# Patient Record
Sex: Male | Born: 1960 | Race: Black or African American | Hispanic: No | Marital: Single | Smoking: Former smoker
Health system: Southern US, Community
[De-identification: ages and names within clinical notes are randomized; demographics above are authoritative.]

## PROBLEM LIST (undated history)

## (undated) DIAGNOSIS — Z973 Presence of spectacles and contact lenses: Secondary | ICD-10-CM

## (undated) DIAGNOSIS — Z8619 Personal history of other infectious and parasitic diseases: Secondary | ICD-10-CM

## (undated) DIAGNOSIS — Q549 Hypospadias, unspecified: Secondary | ICD-10-CM

## (undated) DIAGNOSIS — R369 Urethral discharge, unspecified: Secondary | ICD-10-CM

## (undated) DIAGNOSIS — E785 Hyperlipidemia, unspecified: Secondary | ICD-10-CM

## (undated) DIAGNOSIS — K429 Umbilical hernia without obstruction or gangrene: Secondary | ICD-10-CM

## (undated) HISTORY — DX: Umbilical hernia without obstruction or gangrene: K42.9

## (undated) HISTORY — DX: Personal history of other infectious and parasitic diseases: Z86.19

## (undated) HISTORY — PX: COLONOSCOPY: SHX174

## (undated) HISTORY — DX: Urethral discharge, unspecified: R36.9

## (undated) HISTORY — DX: Hypospadias, unspecified: Q54.9

## (undated) HISTORY — PX: HYPOSPADIAS CORRECTION: SHX483

## (undated) HISTORY — PX: HERNIA REPAIR: SHX51

## (undated) HISTORY — PX: EYE SURGERY: SHX253

## (undated) HISTORY — DX: Hyperlipidemia, unspecified: E78.5

---

## 2006-12-28 ENCOUNTER — Ambulatory Visit: Payer: Self-pay | Admitting: Internal Medicine

## 2006-12-28 LAB — CONVERTED CEMR LAB
AST: 30 units/L (ref 0–37)
BUN: 10 mg/dL (ref 6–23)
Bilirubin Urine: NEGATIVE
CO2: 28 meq/L (ref 19–32)
Calcium: 10.1 mg/dL (ref 8.4–10.5)
Chloride: 100 meq/L (ref 96–112)
Chol/HDL Ratio, serum: 3.2
Cholesterol: 136 mg/dL (ref 0–200)
Creatinine, Ser: 1.1 mg/dL (ref 0.4–1.5)
Eosinophil percent: 8.5 % — ABNORMAL HIGH (ref 0.0–5.0)
HCT: 49.8 % (ref 39.0–52.0)
Hemoglobin: 16.6 g/dL (ref 13.0–17.0)
Ketones, ur: NEGATIVE mg/dL
LDL Cholesterol: 80 mg/dL (ref 0–99)
Lymphocytes Relative: 42.1 % (ref 12.0–46.0)
MCHC: 33.3 g/dL (ref 30.0–36.0)
MCV: 93.2 fL (ref 78.0–100.0)
Monocytes Absolute: 0.4 10*3/uL (ref 0.2–0.7)
Neutro Abs: 1.2 10*3/uL — ABNORMAL LOW (ref 1.4–7.7)
Neutrophils Relative %: 36.1 % — ABNORMAL LOW (ref 43.0–77.0)
PSA: 0.73 ng/mL (ref 0.10–4.00)
RDW: 11 % — ABNORMAL LOW (ref 11.5–14.6)
TSH: 1.02 microintl units/mL (ref 0.35–5.50)
Triglyceride fasting, serum: 63 mg/dL (ref 0–149)
Urine Glucose: NEGATIVE mg/dL

## 2007-01-04 ENCOUNTER — Ambulatory Visit: Payer: Self-pay | Admitting: Internal Medicine

## 2007-01-04 LAB — CONVERTED CEMR LAB
ALT: 38 units/L (ref 0–40)
AST: 31 units/L (ref 0–37)
Albumin: 4.6 g/dL (ref 3.5–5.2)
Basophils Absolute: 0 10*3/uL (ref 0.0–0.1)
Bilirubin Urine: NEGATIVE
Calcium: 10 mg/dL (ref 8.4–10.5)
Chloride: 102 meq/L (ref 96–112)
GFR calc non Af Amer: 77 mL/min
LDL Cholesterol: 79 mg/dL (ref 0–99)
Lymphocytes Relative: 38.5 % (ref 12.0–46.0)
MCV: 91.3 fL (ref 78.0–100.0)
Monocytes Relative: 10.6 % (ref 3.0–11.0)
Neutro Abs: 1.5 10*3/uL (ref 1.4–7.7)
Platelets: 189 10*3/uL (ref 150–400)
Specific Gravity, Urine: 1.02 (ref 1.000–1.03)
Total CHOL/HDL Ratio: 3
Total Protein, Urine: NEGATIVE mg/dL
Triglycerides: 41 mg/dL (ref 0–149)
Urine Glucose: NEGATIVE mg/dL

## 2007-04-12 ENCOUNTER — Ambulatory Visit: Payer: Self-pay | Admitting: Internal Medicine

## 2007-04-12 LAB — CONVERTED CEMR LAB
Basophils Absolute: 0 10*3/uL (ref 0.0–0.1)
Eosinophils Absolute: 0.4 10*3/uL (ref 0.0–0.6)
HCT: 47.4 % (ref 39.0–52.0)
Hemoglobin: 15.9 g/dL (ref 13.0–17.0)
MCHC: 33.5 g/dL (ref 30.0–36.0)
MCV: 91.3 fL (ref 78.0–100.0)
Monocytes Absolute: 0.4 10*3/uL (ref 0.2–0.7)
Neutrophils Relative %: 41.2 % — ABNORMAL LOW (ref 43.0–77.0)
RDW: 11 % — ABNORMAL LOW (ref 11.5–14.6)

## 2007-04-26 ENCOUNTER — Ambulatory Visit: Payer: Self-pay | Admitting: Internal Medicine

## 2007-04-26 LAB — CONVERTED CEMR LAB
ALT: 23 units/L (ref 0–40)
Alkaline Phosphatase: 72 units/L (ref 39–117)
HCV Ab: NEGATIVE
Total CHOL/HDL Ratio: 5
Total Protein: 7.6 g/dL (ref 6.0–8.3)

## 2007-05-02 ENCOUNTER — Ambulatory Visit: Payer: Self-pay | Admitting: Internal Medicine

## 2007-05-02 LAB — CONVERTED CEMR LAB: Hep B Core Total Ab: POSITIVE — AB

## 2007-05-03 ENCOUNTER — Ambulatory Visit: Payer: Self-pay | Admitting: Internal Medicine

## 2007-06-20 ENCOUNTER — Ambulatory Visit: Payer: Self-pay | Admitting: Internal Medicine

## 2007-09-06 ENCOUNTER — Ambulatory Visit: Payer: Self-pay | Admitting: Internal Medicine

## 2007-09-18 ENCOUNTER — Ambulatory Visit: Payer: Self-pay

## 2007-09-27 ENCOUNTER — Ambulatory Visit: Payer: Self-pay | Admitting: Internal Medicine

## 2007-09-27 DIAGNOSIS — R0789 Other chest pain: Secondary | ICD-10-CM | POA: Insufficient documentation

## 2007-09-27 DIAGNOSIS — K219 Gastro-esophageal reflux disease without esophagitis: Secondary | ICD-10-CM | POA: Insufficient documentation

## 2007-09-27 DIAGNOSIS — E785 Hyperlipidemia, unspecified: Secondary | ICD-10-CM | POA: Insufficient documentation

## 2008-01-16 ENCOUNTER — Ambulatory Visit: Payer: Self-pay | Admitting: Internal Medicine

## 2008-01-16 DIAGNOSIS — R42 Dizziness and giddiness: Secondary | ICD-10-CM | POA: Insufficient documentation

## 2008-01-17 ENCOUNTER — Encounter: Payer: Self-pay | Admitting: Internal Medicine

## 2008-01-17 ENCOUNTER — Telehealth: Payer: Self-pay | Admitting: Internal Medicine

## 2008-04-21 ENCOUNTER — Ambulatory Visit: Payer: Self-pay | Admitting: Internal Medicine

## 2008-04-21 DIAGNOSIS — G4720 Circadian rhythm sleep disorder, unspecified type: Secondary | ICD-10-CM

## 2008-04-21 DIAGNOSIS — J069 Acute upper respiratory infection, unspecified: Secondary | ICD-10-CM | POA: Insufficient documentation

## 2008-04-21 DIAGNOSIS — F518 Other sleep disorders not due to a substance or known physiological condition: Secondary | ICD-10-CM | POA: Insufficient documentation

## 2008-08-07 ENCOUNTER — Ambulatory Visit: Payer: Self-pay | Admitting: Internal Medicine

## 2008-08-07 DIAGNOSIS — R5383 Other fatigue: Secondary | ICD-10-CM

## 2008-08-07 DIAGNOSIS — R369 Urethral discharge, unspecified: Secondary | ICD-10-CM | POA: Insufficient documentation

## 2008-08-07 DIAGNOSIS — R5381 Other malaise: Secondary | ICD-10-CM | POA: Insufficient documentation

## 2008-08-15 ENCOUNTER — Encounter: Payer: Self-pay | Admitting: Internal Medicine

## 2008-09-01 ENCOUNTER — Ambulatory Visit: Payer: Self-pay | Admitting: Internal Medicine

## 2008-09-01 LAB — CONVERTED CEMR LAB
ALT: 27 units/L (ref 0–53)
AST: 24 units/L (ref 0–37)
Albumin: 4.4 g/dL (ref 3.5–5.2)
Alkaline Phosphatase: 66 units/L (ref 39–117)
BUN: 10 mg/dL (ref 6–23)
Bilirubin, Direct: 0.3 mg/dL (ref 0.0–0.3)
CO2: 30 meq/L (ref 19–32)
Calcium: 9.4 mg/dL (ref 8.4–10.5)
Chloride: 113 meq/L — ABNORMAL HIGH (ref 96–112)
Cholesterol: 121 mg/dL (ref 0–200)
Creatinine, Ser: 1.1 mg/dL (ref 0.4–1.5)
GFR calc Af Amer: 92 mL/min
GFR calc non Af Amer: 76 mL/min
Glucose, Bld: 92 mg/dL (ref 70–99)
HDL: 36.3 mg/dL — ABNORMAL LOW (ref >39.0)
LDL Cholesterol: 76 mg/dL (ref 0–99)
Potassium: 4.4 meq/L (ref 3.5–5.1)
Sodium: 144 meq/L (ref 135–145)
TSH: 0.85 microintl units/mL (ref 0.35–5.50)
Total Bilirubin: 1.5 mg/dL — ABNORMAL HIGH (ref 0.3–1.2)
Total CHOL/HDL Ratio: 3.3
Total Protein: 7.3 g/dL (ref 6.0–8.3)
Triglycerides: 44 mg/dL (ref 0–149)
VLDL: 9 mg/dL (ref 0–40)
Vit D, 1,25-Dihydroxy: 19 — ABNORMAL LOW (ref 30–89)

## 2008-09-09 ENCOUNTER — Encounter: Payer: Self-pay | Admitting: Internal Medicine

## 2008-09-11 ENCOUNTER — Ambulatory Visit: Payer: Self-pay | Admitting: Internal Medicine

## 2009-01-29 ENCOUNTER — Ambulatory Visit: Payer: Self-pay | Admitting: Internal Medicine

## 2009-01-29 DIAGNOSIS — K429 Umbilical hernia without obstruction or gangrene: Secondary | ICD-10-CM | POA: Insufficient documentation

## 2009-01-29 DIAGNOSIS — K5909 Other constipation: Secondary | ICD-10-CM | POA: Insufficient documentation

## 2009-04-14 ENCOUNTER — Ambulatory Visit: Payer: Self-pay | Admitting: Internal Medicine

## 2009-04-14 DIAGNOSIS — J018 Other acute sinusitis: Secondary | ICD-10-CM | POA: Insufficient documentation

## 2009-04-20 ENCOUNTER — Telehealth: Payer: Self-pay | Admitting: Internal Medicine

## 2009-04-20 ENCOUNTER — Ambulatory Visit: Payer: Self-pay | Admitting: Radiology

## 2009-04-20 ENCOUNTER — Ambulatory Visit: Payer: Self-pay | Admitting: Internal Medicine

## 2009-04-20 ENCOUNTER — Ambulatory Visit (HOSPITAL_BASED_OUTPATIENT_CLINIC_OR_DEPARTMENT_OTHER): Admission: RE | Admit: 2009-04-20 | Discharge: 2009-04-20 | Payer: Self-pay | Admitting: Internal Medicine

## 2009-04-20 DIAGNOSIS — R05 Cough: Secondary | ICD-10-CM

## 2009-04-20 DIAGNOSIS — R059 Cough, unspecified: Secondary | ICD-10-CM | POA: Insufficient documentation

## 2009-04-21 ENCOUNTER — Encounter: Payer: Self-pay | Admitting: Internal Medicine

## 2009-04-28 ENCOUNTER — Ambulatory Visit: Payer: Self-pay | Admitting: Internal Medicine

## 2009-04-28 LAB — CONVERTED CEMR LAB
Alkaline Phosphatase: 61 units/L (ref 39–117)
Basophils Relative: 0.2 % (ref 0.0–3.0)
Bilirubin, Direct: 0.1 mg/dL (ref 0.0–0.3)
CO2: 32 meq/L (ref 19–32)
Calcium: 9.7 mg/dL (ref 8.4–10.5)
Creatinine, Ser: 1 mg/dL (ref 0.4–1.5)
Eosinophils Relative: 1.3 % (ref 0.0–5.0)
GFR calc non Af Amer: 102.67 mL/min (ref >60)
HDL: 39.2 mg/dL (ref >39.00)
LDL Cholesterol: 118 mg/dL — ABNORMAL HIGH (ref 0–99)
Lymphocytes Relative: 33 % (ref 12.0–46.0)
MCV: 91.9 fL (ref 78.0–100.0)
Monocytes Relative: 8.1 % (ref 3.0–12.0)
Neutrophils Relative %: 57.4 % (ref 43.0–77.0)
PSA: 0.97 ng/mL (ref 0.10–4.00)
Platelets: 168 10*3/uL (ref 150.0–400.0)
RBC: 5.3 M/uL (ref 4.22–5.81)
Sodium: 141 meq/L (ref 135–145)
TSH: 1.74 microintl units/mL (ref 0.35–5.50)
Total Bilirubin: 1.9 mg/dL — ABNORMAL HIGH (ref 0.3–1.2)
Total CHOL/HDL Ratio: 5
Total Protein: 7.5 g/dL (ref 6.0–8.3)
Triglycerides: 157 mg/dL — ABNORMAL HIGH (ref 0.0–149.0)
WBC: 8 10*3/uL (ref 4.5–10.5)

## 2009-05-02 ENCOUNTER — Encounter: Payer: Self-pay | Admitting: Internal Medicine

## 2009-06-18 ENCOUNTER — Encounter: Payer: Self-pay | Admitting: Internal Medicine

## 2009-11-03 ENCOUNTER — Ambulatory Visit: Payer: Self-pay | Admitting: Internal Medicine

## 2009-11-03 DIAGNOSIS — Z86010 Personal history of colonic polyps: Secondary | ICD-10-CM | POA: Insufficient documentation

## 2009-11-03 LAB — CONVERTED CEMR LAB
ALT: 24 units/L (ref 0–53)
AST: 27 units/L (ref 0–37)
Bilirubin, Direct: 0.3 mg/dL (ref 0.0–0.3)
Cholesterol: 163 mg/dL (ref 0–200)
Total CHOL/HDL Ratio: 3.9
Total Protein: 7.1 g/dL (ref 6.0–8.3)
Triglycerides: 65 mg/dL (ref <150)
VLDL: 13 mg/dL (ref 0–40)

## 2009-11-04 ENCOUNTER — Encounter: Payer: Self-pay | Admitting: Internal Medicine

## 2010-01-11 ENCOUNTER — Ambulatory Visit: Payer: Self-pay | Admitting: Internal Medicine

## 2010-02-17 ENCOUNTER — Ambulatory Visit: Payer: Self-pay | Admitting: Family

## 2010-02-17 DIAGNOSIS — J40 Bronchitis, not specified as acute or chronic: Secondary | ICD-10-CM | POA: Insufficient documentation

## 2010-09-21 ENCOUNTER — Telehealth (INDEPENDENT_AMBULATORY_CARE_PROVIDER_SITE_OTHER): Payer: Self-pay | Admitting: *Deleted

## 2010-09-22 ENCOUNTER — Ambulatory Visit: Payer: Self-pay | Admitting: Internal Medicine

## 2010-09-22 DIAGNOSIS — J310 Chronic rhinitis: Secondary | ICD-10-CM | POA: Insufficient documentation

## 2010-09-23 ENCOUNTER — Telehealth: Payer: Self-pay | Admitting: Internal Medicine

## 2010-09-23 ENCOUNTER — Encounter: Payer: Self-pay | Admitting: Internal Medicine

## 2010-12-02 ENCOUNTER — Telehealth: Payer: Self-pay | Admitting: Internal Medicine

## 2011-01-09 ENCOUNTER — Encounter: Payer: Self-pay | Admitting: Internal Medicine

## 2011-01-18 NOTE — Assessment & Plan Note (Signed)
Summary: bronchitis?/mhf   Vital Signs:  Patient profile:   50 year old male Weight:      163.75 pounds BMI:     26.13 O2 Sat:      96 % on Room air Temp:     98.4 degrees F oral Pulse rate:   86 / minute Pulse rhythm:   regular Resp:     20 per minute BP sitting:   110 / 72  (right arm) Cuff size:   regular  Vitals Entered By: Glendell Docker CMA (January 11, 2010 11:30 AM)  O2 Flow:  Room air  Primary Care Provider:  D. Thomos Lemons DO  CC:  URI symptoms.  History of Present Illness:  URI Symptoms      This is a 50 year old man who presents with URI symptoms.  The patient reports nasal congestion, purulent nasal discharge, sore throat, dry cough, earache, and sick contacts.  Associated symptoms include low-grade fever (<100.5 degrees).  The patient denies stiff neck, dyspnea, wheezing, and use of an antipyretic.  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)    - Normal U/S of bladder and kidneys    - Normal PSA  Positive Hep B core antibody - Hep B viral load normal 07/08  Umbilical hernia - asymptomatic      Past Surgical History: Surgery for hypospadias         Family History: Family History Diabetes 1st degree relative Family History Hypertension Family History of Prostate CA 1st degree relative <50   Family History of Stroke F 1st degree relative <60       Social History: Single lives alone  Not sexually active  Occupation: HR specialist for [employer] Former Smoker Alcohol use-no          Physical Exam  General:  alert, well-developed, and well-nourished.   Eyes:  pupils equal, pupils round, and pupils reactive to light.   Ears:  R ear normal and L ear normal.   Mouth:  no exudates and pharyngeal erythema.   Neck:  No deformities, masses, or tenderness  noted. Lungs:  normal respiratory effort, normal breath sounds, no crackles, and no wheezes.   Heart:  normal rate, regular rhythm, and no gallop.     Impression & Recommendations:  Problem # 1:  RHINOSINUSITIS, ACUTE (ICD-461.8)  His updated medication list for this problem includes:    Amoxicillin 875 Mg Tabs (Amoxicillin) ..... One by mouth two times a day  Instructed on treatment. Call if symptoms persist or worsen.   Complete Medication List: 1)  Simvastatin 20 Mg Tabs (Simvastatin) .... One by mouth qpm 2)  Amoxicillin 875 Mg Tabs (Amoxicillin) .... One by mouth two times a day  Patient Instructions: 1)  Call our office if your symptoms do not  improve or gets worse. Prescriptions: AMOXICILLIN 875 MG TABS (AMOXICILLIN) one by mouth two times a day  #20 x 0   Entered and Authorized by:   D. Thomos Lemons DO   Signed by:   D. Thomos Lemons DO on 01/11/2010   Method used:   Electronically to        [facility] (retail)       [address]       [place], [place]  [address]       Ph: [phone]       Fax: [phone]   RxID:   [id number]   Current Allergies (reviewed today): No known allergies

## 2011-01-18 NOTE — Progress Notes (Signed)
  Phone Note Other Incoming   Request: Send information Summary of Call: Request for records received from [facility]. Request forwarded to [facility].     

## 2011-01-18 NOTE — Letter (Signed)
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 [address]   [place], [place] [address]   Phone: [phone]  Fax: [phone]      January 11, 2010     Employee:  Derek Murray    To Whom It May Concern:   For Medical reasons, please excuse the above named employee from work for the following dates:  Start:   01/11/2010  End:   01/13/2010  He may resume normal work schedule 01/14/2010.If you need additional information, please feel free to contact our office.          Sincerely,    Glendell Docker CMA Dr Thomos Lemons

## 2011-01-18 NOTE — Letter (Signed)
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 [address]   [place], [place] [address]   Phone: [phone]  Fax: [phone]    February 17, 2010   Employee:  SUEDE GREENAWALT    To Whom It May Concern:   For Medical reasons, please excuse the above named employee from work for the following dates:  Start:   3/2  End:   May return to work Monday 3/7  If you need additional information, please feel free to contact our office.         Sincerely,    Lemont Fillers FNP

## 2011-01-18 NOTE — Assessment & Plan Note (Signed)
Summary: PRODUCTIVE COUGH CONGESTION/MHF   Vital Signs:  Patient profile:   50 year old male Weight:      173.50 pounds BMI:     27.68 O2 Sat:      98 % on Room air Temp:     98.3 degrees F oral Pulse rate:   72 / minute Pulse rhythm:   regular Resp:     16 per minute BP sitting:   118 / 86  (right arm) Cuff size:   regular  Vitals Entered By: Mervin Kung CMA (February 17, 2010 1:35 PM)  O2 Flow:  Room air CC: room 5  Productive cough x 6 days   Primary Care Provider:  Dondra Spry DO  CC:  room 5  Productive cough x 6 days.  History of Present Illness: Derek Murray is a 50 year old male who presents with 6 day history of cough.  Cough is productive of green phlegm.  Denies fever.  +Sinus congestion- + sinus pressure when he coughs.  Has not tried any OTC meds.  Denies N/V or diarrhea.    Allergies (verified): No Known Drug Allergies  Physical Exam  General:  Well-developed,well-nourished,in no acute distress; alert,appropriate and cooperative throughout examination Head:  Normocephalic and atraumatic without obvious abnormalities.  Eyes:  PERRLA Ears:  External ear exam shows no significant lesions or deformities.  Otoscopic examination reveals clear canals, tympanic membranes are intact bilaterally without bulging, retraction, inflammation or discharge. Hearing is grossly normal bilaterally. Mouth:  mild pharyngeal erythema without exudates Lungs:  Normal respiratory effort, chest expands symmetrically. Lungs are clear to auscultation, no crackles or wheezes. Heart:  Normal rate and regular rhythm. S1 and S2 normal without gallop, murmur, click, rub or other extra sounds.   Impression & Recommendations:  Problem # 1:  BRONCHITIS (ICD-490) Assessment New Will plan to treat with zithromax and as needed tessalon for cough.  Patient instructed to call if worsening cough, fever over 101, or malaise.   The following medications were removed from the medication list:  Amoxicillin 875 Mg Tabs (Amoxicillin) ..... One by mouth two times a day His updated medication list for this problem includes:    Zithromax Z-pak 250 Mg Tabs (Azithromycin) .[facility]... 2 tabs by mouth today, then 1 tab by mouth daily x 4 more days    Tessalon Perles 100 Mg Caps (Benzonatate) ..... One cap by mouth three times a day as needed for cough  Complete Medication List: 1)  Simvastatin 20 Mg Tabs (Simvastatin) .... One by mouth qpm 2)  Zithromax Z-pak 250 Mg Tabs (Azithromycin) .... 2 tabs by mouth today, then 1 tab by mouth daily x 4 more days 3)  Tessalon Perles 100 Mg Caps (Benzonatate) .... One cap by mouth three times a day as needed for cough  Patient Instructions: 1)  Call if fever over 101, worsening cough, weakness, or if your symptoms do not improve. 2)  Start antibiotics today Prescriptions: TESSALON PERLES 100 MG CAPS (BENZONATATE) one cap by mouth three times a day as needed for cough  #30 x 0   Entered and Authorized by:   Lemont Fillers FNP   Signed by:   Lemont Fillers FNP on 02/17/2010   Method used:   Electronically to        [facility] ([facility]ail)       [address]       [facility]       [place], [place]  [address]       Ph: [phone]       Fax: [phone]   RxID:   [id number] ZITHROMAX Z-PAK 250 MG TABS (AZITHROMYCIN) 2 tabs by mouth today, then 1 tab by mouth daily x 4 more days  #1 pack x 0   Entered and Authorized by:   Lemont Fillers FNP   Signed by:   Lemont Fillers FNP on 02/17/2010   Method used:   Electronically to        [facility] ([facility])       [address]       [place], [place]  [address]       Ph: [phone]       Fax: [phone]   RxID:   [id number]   Current Allergies (reviewed today): No known allergies

## 2011-01-18 NOTE — Letter (Signed)
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 [address]   [place], [place] [address]   Phone: [phone]  Fax: [phone]    September 23, 2010   Employee:  Derek Murray    To Whom It May Concern:   For Medical reasons, please excuse the above named employee from work for the following dates:  Start:   09/23/10  End:   09/23/10  If you need additional information, please feel free to contact our office.         Sincerely,    Glendell Docker CMA Dr Thomos Lemons

## 2011-01-18 NOTE — Progress Notes (Signed)
Summary: Work note  Phone Note Call from Patient Call back at [employer] [phone]   Caller: Patient Call For: D. Thomos Lemons DO Summary of Call: patient called and left voice message stating he forget to a work note from Dr Artist Pais while he was here.  He would like to know if Dr Artist Pais would provide him a work note to be out work for 10/6 and 10/7. If approved he would like for the note to mailed to him to the address we have on file Initial call taken by: Glendell Docker CMA,  September 23, 2010 1:13 PM  Follow-up for Phone Call        we can only provide work note for 10/6 Follow-up by: D. Thomos Lemons DO,  September 23, 2010 1:17 PM  Additional Follow-up for Phone Call Additional follow up Details #1::        call returned to patient at [phone]. He has been advised per Dr Artist Pais instructions. He is requesting his note faxed to [phone].  Additional Follow-up by: Glendell Docker CMA,  September 23, 2010 2:26 PM    Additional Follow-up for Phone Call Additional follow up Details #2::    Letter has been faxed to number provided by patient  Follow-up by: Glendell Docker CMA,  September 23, 2010 2:30 PM

## 2011-01-18 NOTE — Assessment & Plan Note (Signed)
Summary: sinus infection/mhf   Vital Signs:  Patient profile:   50 year old male Height:      66.5 inches Weight:      167 pounds BMI:     26.65 O2 Sat:      99 % on Room air Temp:     98.0 degrees F oral Pulse rate:   60 / minute Pulse rhythm:   regular Resp:     18 per minute BP sitting:   110 / 80  (left arm) Cuff size:   large  Vitals Entered By: Glendell Docker CMA (September 22, 2010 11:25 AM)  O2 Flow:  Room air CC: ? sinus infection Is Patient Diabetic? No Pain Assessment Patient in pain? no      Comments c/o sinus drainage green-brownish green on color, denies cough, throat irritation, no self care measures taken   Primary Care Provider:  Dondra Spry DO  CC:  ? sinus infection.  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)    - Normal U/S of bladder and kidneys     - Normal PSA   Positive Hep B core antibody - Hep B viral load normal 07/08  Umbilical hernia - asymptomatic      Past Surgical History: Surgery for hypospadias          Family History: Family History Diabetes 1st degree relative Family History Hypertension Family History of Prostate CA 1st degree relative <50   Family History of Stroke F 1st degree relative <60        Social History: Single lives alone  Not sexually active  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   Ears:  R ear normal and L ear normal.   Nose:  mucosal erythema and mucosal edema.   Mouth:   pharyngeal erythema.   Lungs:  Normal respiratory effort, chest expands symmetrically. Lungs are clear to auscultation, no crackles or wheezes. Heart:  Normal rate and regular rhythm. S1 and S2 normal without gallop, murmur, click, rub or other extra sounds.   Impression & Recommendations:  Problem # 1:  RHINITIS (ICD-472.0) rhinitis from probable URI. I rec conservative measures - Lloyd Huger Med sinus rinse Patient advised to call office if symptoms persist or worsen.  Complete Medication List: 1)  Simvastatin 20 Mg Tabs (Simvastatin) .... One by mouth qpm  Patient Instructions: 1)  Use Lloyd Huger Med sinus rinse over the counter 2)  Call our office if your symptoms do not  improve or gets worse.   Immunization History:  Influenza Immunization History:    Influenza:  declined (09/22/2010)   Contraindications/Deferment of Procedures/Staging:    Test/Procedure: FLU VAX    Reason for deferment: patient declined   Current Allergies (reviewed today): No known allergies

## 2011-01-20 NOTE — Progress Notes (Signed)
Summary: Simvastatin refill  Phone Note Refill Request Message from:  Patient on December 02, 2010 9:59 AM  Refills Requested: Medication #1:  SIMVASTATIN 20 MG TABS one by mouth qpm. patient called and left voice message requesting refill on Zocor to pharmacy. He states he was informed by pharmacist that he needs a new rx.   Method Requested: Electronic Next Appointment Scheduled: None Initial call taken by: Glendell Docker CMA,  December 02, 2010 9:59 AM  Follow-up for Phone Call        Rx completed in Dr. Tiajuana Amass Follow-up by: Glendell Docker CMA,  December 02, 2010 10:00 AM    Prescriptions: SIMVASTATIN 20 MG TABS (SIMVASTATIN) one by mouth qpm  #90 x 0   Entered by:   Glendell Docker CMA   Authorized by:   D. Thomos Lemons DO   Signed by:   Glendell Docker CMA on 12/02/2010   Method used:   Electronically to        [facility]       [address]       [place], [place]  [address]       Ph: [phone]       Fax: [phone]   RxID:   [id number]

## 2011-04-01 ENCOUNTER — Telehealth: Payer: Self-pay | Admitting: Internal Medicine

## 2011-04-01 DIAGNOSIS — E785 Hyperlipidemia, unspecified: Secondary | ICD-10-CM

## 2011-04-01 MED ORDER — SIMVASTATIN 20 MG PO TABS: 20.0000 mg | ORAL_TABLET | Freq: Every day | ORAL | Status: DC

## 2011-04-01 NOTE — Telephone Encounter (Signed)
Rx refill for Simvastatin sent to pharmacy. Patient due for office visit

## 2011-04-01 NOTE — Telephone Encounter (Signed)
Refill- simvastatin 20mg  tab. Take 1 tablet by mouth every evening. Qty 90. Last fill 12.15.11

## 2011-05-06 NOTE — Assessment & Plan Note (Signed)
[facility]                           PRIMARY CARE OFFICE NOTE   NAME:Derek Murray, Derek Murray                     MRN:          [id number]  DATE:01/04/2007                            DOB:          January 08, 1961    CHIEF COMPLAINT:  New patient to practice.   HISTORY OF PRESENT ILLNESS:  The patient is a 50 year old African  American male here to establish primary care.  He moved to [place]  from [place], [place] one and a half years ago.  He has been to a  [facility] in [place], [place] but wishes to  establish here for primary care.  He was noted to have elevated  cholesterol readings approximately 6 months ago.  He was started on  Simvastatin 20 mg at bedtime.  His cholesterol prior to starting Zocor,  LDL was 173, total cholesterol 236, triglycerides 82, and HDL 47.  He  has been tolerating simvastatin without any difficulty.  Denies any  myalgias.  He has had recent blood work with [place], checking his LFTs which  are normal with the exception of mildly elevated bilirubin at 2.  He  notes that he has a remote history of hepatitis B.  He states that he is  not a carrier.  He cleared the infection.  He does not recall or know  the way he contracted hepatitis B.  He is homosexual, although he states  that he has not been sexually active for 4-5 years.   While he lived in [place], there were some issues with urinary tract  infections and what sounds like prostatitis.  The patient was treated  with several courses of antibiotics.  We do not have records from his  previous physicians.  He apparently also had elevated PSA levels which  have subsequently returned to normal.  His most recent PSA performed on  December 28, 2006, with [place] was 0.73.   No other hospitalizations or illnesses.  He does have a history of  congenital hypospadia which was surgically corrected when he was a  child.   CURRENT MEDICATIONS:  1. Simvastatin 20 mg  once a day.  2. Multivitamin one a day.   ALLERGIES TO MEDICATIONS:  NONE KNOWN.   SOCIAL HISTORY:  The patient is single, currently lives alone.  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   FAMILY HISTORY:  Both parents are living.  Mother is age 80, has  hypertension and type 2 diabetes.  Father is age 34, has hypertension,  history of stroke, diabetes, and prostate cancer.   HABITS:  He does not drink.  He quit tobacco in 1995.  He was a light  smoker for approximately 13 years.  He states that he went through a  pack every 2-3 weeks.   REVIEW OF SYSTEMS:  No fevers, chills, no night sweats, denies any  history of adenopathy, no history of chest pain, shortness of breath,  occasional abdominal pain which he describes as slight cramp which has  resolved since starting multivitamin.  No current GU symptoms.  All  other systems negative.  PHYSICAL EXAMINATION:  VITAL SIGNS:  Height is 5 feet 9 inches, weight  is 161 pounds, temperature is 98, pulse is 65, BP is 127/74.  GENERAL:  The patient is a pleasant, well-developed, well-nourished, 34-  year-old, African American male in no apparent distress who appears  somewhat younger than stated age.  HEENT:  Normocephalic atraumatic.  Pupils are equal and reactive to  light bilaterally.  Extraocular motility was intact.  The patient was  anicteric.  Conjunctivae within normal limits.  External auditory canals  and tympanic membranes are clear bilaterally.  Hearing was grossly  normal.  Oropharyngeal exam was unremarkable.  NECK:  Supple.  Did not appreciate any adenopathy, thyromegaly, carotid  bruit.  CHEST:  Normal respiratory effort.  Chest is clear to auscultation  bilaterally.  No rhonchi, rales, or wheezing.  CARDIOVASCULAR:  Regular rate and rhythm.  No significant murmurs, rubs,  or gallops appreciated.  ABDOMEN:  Soft.  There was a small umbilical hernia.  No organomegaly.  MUSCULOSKELETAL:  No clubbing,  cyanosis, or edema.  SKIN:  Warm and dry.  He had a small 1-cm scar on his left forearm.  NEUROLOGIC:  Cranial nerves II-XII grossly intact.  He was nonfocal.  His mood and affect were appropriate.  RECTAL:  A digital rectal exam was performed which revealed normal  rectal tone.  The patient had normal size prostate with no evidence of  nodules, normal contour, and no firmness was noted.   LABORATORY DATA:  His most recent blood work noted CBC where he had mild  neutropenia, white blood cell count 3.2, with slightly abnormal  differential, neutrophils 36.1%, monocytes 12.2, eosinophils 8.5,  basophils 1.1, neutrophils absolute 1.2.  His cholesterol panel, total  cholesterol 136, triglycerides 63, HDL 43.1, LDL at 80.  Comprehensive  metabolic profile notable for a BUN 10, creatinine 1.1, potassium 4.1,  total bilirubin mildly elevated at 2 and his TSH was 1.02.  A PSA was  0.73, and a UA was unremarkable.   IMPRESSION:  1. Hyperlipidemia.  2. Mild neutropenia.  3. Remote history of hepatitis B.  4. History of prostatitis.  5. Health maintenance.   RECOMMENDATIONS:  1. The patient is to continue his simvastatin, he is tolerating well.      We discussed the need for every 29-month to 1-year followup LFTs.  2. We will try to obtain previous records regarding status of      hepatitis B.  At this time, I am not going to check his titers,      hepatitis B titers.  3. We discussed various reasons for mild neutropenia.  I suggested      possibly discontinuing over-the-counter supplements.  However, due      to the patient's lifestyle history, we will screen him for HIV.      The last time of screening was when he was in the [employer] in the      early 1990s.  4. He had a normal prostate exam today in terms of his prostatitis and      recommended followup PSA on a yearly basis.  However, at this time      no urgent need for urologic evaluation.  Followup time is in approximately 3  months.     Barbette Hair. Artist Pais, DO  Electronically Signed    RDY/MedQ  DD: 01/04/2007  DT: 01/04/2007  Job #: [id number]

## 2011-08-10 ENCOUNTER — Other Ambulatory Visit: Payer: Self-pay | Admitting: *Deleted

## 2011-08-10 ENCOUNTER — Encounter: Payer: Self-pay | Admitting: *Deleted

## 2011-08-10 DIAGNOSIS — E785 Hyperlipidemia, unspecified: Secondary | ICD-10-CM

## 2011-08-10 MED ORDER — SIMVASTATIN 20 MG PO TABS: 20.0000 mg | ORAL_TABLET | Freq: Every day | ORAL | Status: DC

## 2011-08-10 NOTE — Telephone Encounter (Signed)
Rx refill sent to pharmacy. Office visit is past due.

## 2011-08-10 NOTE — Telephone Encounter (Signed)
Patient called and left voice message requesting a refill on his Zocor to [facility]. He was also wanting to know when he was due for physical exam.

## 2011-11-28 ENCOUNTER — Ambulatory Visit (INDEPENDENT_AMBULATORY_CARE_PROVIDER_SITE_OTHER): Payer: Federal, State, Local not specified - PPO | Admitting: Internal Medicine

## 2011-11-28 ENCOUNTER — Encounter: Payer: Self-pay | Admitting: Internal Medicine

## 2011-11-28 DIAGNOSIS — E785 Hyperlipidemia, unspecified: Secondary | ICD-10-CM

## 2011-11-28 DIAGNOSIS — Z125 Encounter for screening for malignant neoplasm of prostate: Secondary | ICD-10-CM

## 2011-11-28 DIAGNOSIS — Z0000 Encounter for general adult medical examination without abnormal findings: Secondary | ICD-10-CM

## 2011-11-28 DIAGNOSIS — N5089 Other specified disorders of the male genital organs: Secondary | ICD-10-CM

## 2011-11-28 LAB — LIPID PANEL
Cholesterol: 146 mg/dL (ref 0–200)
HDL: 41.6 mg/dL (ref >39.00)
VLDL: 14.4 mg/dL (ref 0.0–40.0)

## 2011-11-28 LAB — BASIC METABOLIC PANEL
BUN: 12 mg/dL (ref 6–23)
Calcium: 9.3 mg/dL (ref 8.4–10.5)
GFR: 86.45 mL/min (ref >60.00)
Glucose, Bld: 79 mg/dL (ref 70–99)
Potassium: 3.9 mEq/L (ref 3.5–5.1)
Sodium: 138 mEq/L (ref 135–145)

## 2011-11-28 LAB — HEPATIC FUNCTION PANEL
Albumin: 4.4 g/dL (ref 3.5–5.2)
Total Bilirubin: 2.2 mg/dL — ABNORMAL HIGH (ref 0.3–1.2)

## 2011-11-28 LAB — PSA: PSA: 0.93 ng/mL (ref 0.10–4.00)

## 2011-11-28 MED ORDER — SIMVASTATIN 20 MG PO TABS: 20.0000 mg | ORAL_TABLET | Freq: Every day | ORAL | Status: DC

## 2011-11-28 NOTE — Assessment & Plan Note (Signed)
Patient tolerating simvastatin without difficulty. 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 Monitor lipids and LFTs. Continue same dose of simvastatin.

## 2011-11-28 NOTE — Assessment & Plan Note (Signed)
50 year old American male with left scrotal swelling. On exam patient has mild fullness of either epididymis or scrotal veins. Patient reassured. Monitor for now. If there is significant change in size or symptomatology we discussed obtaining scrotal ultrasound.

## 2011-11-28 NOTE — Progress Notes (Signed)
Subjective:    Patient ID: Derek Murray, male    DOB: Mar 30, 1961, 50 y.o.   MRN: [id number]  HPI  50 year old [place] American male with history of hyperlipidemia for routine physical. He denies significant interval medical history.  He is still working for the [employer]. He notes mild weight gain since her previous physical. He denies any significant change his social history. He does not use tobacco or alcohol. He denies high-risk sexual contacts.  He denies any significant change in family history.  He reports left scrotal swelling that he noticed for last several months.  Area is non tender.  Hyperlipidemia - stable.  Tolerating medication.  No side effects reported.  Review of Systems   Constitutional: Negative for activity change, appetite change and unexpected weight change.  Eyes: Negative for visual disturbance.  Respiratory: Negative for cough, chest tightness and shortness of breath.   Cardiovascular: Negative for chest pain.  Genitourinary: Negative for difficulty urinating. He reports left scrotal fullness Neurological: Negative for headaches.  Gastrointestinal: Negative for abdominal pain, heartburn melena or hematochezia Psych: Negative for depression or anxiety    Past Medical History  Diagnosis Date  . Hyperlipidemia   . Hypospadias     congenital hypospadia-surgically corrected  . 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 Umbilical hernia     asymptomatic    History   Social History  . Marital Status: Single    Spouse Name: N/A    Number of Children: N/A  . Years of Education: N/A   Occupational History  . Not on file.   Social History Main Topics  . Smoking status: Former Games developer  . Smokeless tobacco: Not on file  . Alcohol Use: No  . Drug Use: No  . Sexually Active: Not on file   Other Topics Concern  . 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 Hypospadias correction     Family History  Problem Relation Age of Onset  . Diabetes    . Hypertension    . Prostate cancer    . Stroke      Allergies not on file  No current outpatient prescriptions on file prior to visit.    BP 124/68  Pulse 72  Temp(Src) 98.9 F (37.2 C) (Oral)  Ht 5\' 6"  (1.676 m)  Wt 174 lb (78.926 kg)  BMI 28.08 kg/m2      Objective:   Physical Exam  Constitutional: He is oriented to person, place, and time. He appears well-developed and well-nourished.  HENT:  Head: Normocephalic and atraumatic.  Right Ear: External ear normal.  Left Ear: External ear normal.  Mouth/Throat: Oropharynx is clear and moist.  Eyes: Conjunctivae are normal. Pupils are equal, round, and reactive to light.  Neck: Normal range of motion. Neck supple.  Cardiovascular: Normal rate, regular rhythm, normal heart sounds and intact distal pulses.  Exam reveals no friction rub.   No murmur heard. Pulmonary/Chest: Effort normal and breath sounds normal. No respiratory distress. He has no wheezes.  Abdominal: Bowel sounds are normal. He exhibits no distension and no mass. There is no rebound and no guarding.  Genitourinary: Rectum normal, prostate normal and penis normal. Guaiac negative stool.  Mild fullness of left epididymis / scrotal veins  Musculoskeletal: Normal range of motion.  Lymphadenopathy:    He has no cervical adenopathy.  Neurological: He is alert and oriented to person, place, and time. No cranial nerve deficit.  Skin: Skin is warm and dry.          Assessment & Plan:

## 2011-11-28 NOTE — Patient Instructions (Signed)
Please follow up with Dr. Jarold Motto re:  Repeat colonoscopy We will contact you re: blood test results.

## 2011-11-28 NOTE — Assessment & Plan Note (Addendum)
Reviewed adult health maintenance protocols.  Weight loss encouraged.  We discussed the pros and cons of prostate cancer screening. Patient elects to have screening. Digital rectal exam is normal. Obtain PSA. His last colonoscopy was 2 years ago. Dr. Jarold Motto noted one polyp and poor prep. Patient advised to contact gastroenterologist for repeat colonoscopy.

## 2011-11-30 ENCOUNTER — Encounter: Payer: Self-pay | Admitting: Internal Medicine

## 2012-02-02 ENCOUNTER — Ambulatory Visit (INDEPENDENT_AMBULATORY_CARE_PROVIDER_SITE_OTHER): Payer: Federal, State, Local not specified - PPO | Admitting: Internal Medicine

## 2012-02-02 ENCOUNTER — Encounter: Payer: Self-pay | Admitting: Internal Medicine

## 2012-02-02 DIAGNOSIS — R059 Cough, unspecified: Secondary | ICD-10-CM

## 2012-02-02 DIAGNOSIS — E785 Hyperlipidemia, unspecified: Secondary | ICD-10-CM

## 2012-02-02 DIAGNOSIS — R05 Cough: Secondary | ICD-10-CM

## 2012-02-02 DIAGNOSIS — J069 Acute upper respiratory infection, unspecified: Secondary | ICD-10-CM

## 2012-02-02 MED ORDER — HYDROCODONE-HOMATROPINE 5-1.5 MG/5ML PO SYRP: 5.0000 mL | ORAL_SOLUTION | Freq: Four times a day (QID) | ORAL | Status: AC | PRN

## 2012-02-02 NOTE — Progress Notes (Signed)
Subjective:    Patient ID: Derek Murray, male    DOB: Apr 11, 1961, 51 y.o.   MRN: [id number]  HPI  51 year old patient who is seen today with a chief complaint of cough and congestion of 2 days duration he has some headache associated head congestion and a mildly productive cough. He is felt unwell with weakness. Denies any fever chills chest pain or shortness of breath. He states that there has been a viral illness at his place of employment. He has a history of dyslipidemia but has self discontinued simvastatin. No side effects. He states that he wishes to try lifestyle changes to manage his cholesterol. He is concerned about long-term side effects  Past Medical History  Diagnosis Date  . Hyperlipidemia   . Hypospadias     congenital hypospadia-surgically corrected  . 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 Umbilical hernia     asymptomatic    History   Social History  . Marital Status: Single    Spouse Name: N/A    Number of Children: N/A  . Years of Education: N/A   Occupational History  . Not on file.   Social History Main Topics  . Smoking status: Former Games developer  . Smokeless tobacco: Never Used  . Alcohol Use: No  . Drug Use: No  . Sexually Active: Not on file   Other Topics Concern  . 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 Hypospadias correction     Family History  Problem Relation Age of Onset  . Diabetes    . Hypertension    . Prostate cancer    . Stroke      No Known Allergies  Current Outpatient Prescriptions on File Prior to Visit  Medication Sig Dispense Refill  . simvastatin (ZOCOR) 20 MG tablet Take 1 tablet (20 mg total) by mouth at bedtime. Office visit is due for future refills  90 tablet   3    BP 118/70  Temp(Src) 99 F (37.2 C) (Oral)  Wt 179 lb (81.194 kg)       Review of Systems  Constitutional: Positive for fatigue. Negative for fever, chills and appetite change.  HENT: Positive for congestion and rhinorrhea. Negative for hearing loss, ear pain, sore throat, trouble swallowing, neck stiffness, dental problem, voice change and tinnitus.   Eyes: Negative for pain, discharge and visual disturbance.  Respiratory: Positive for cough. Negative for chest tightness, wheezing and stridor.   Cardiovascular: Negative for chest pain, palpitations and leg swelling.  Gastrointestinal: Negative for nausea, vomiting, abdominal pain, diarrhea, constipation, blood in stool and abdominal distention.  Genitourinary: Negative for urgency, hematuria, flank pain, discharge, difficulty urinating and genital sores.  Musculoskeletal: Negative for myalgias, back pain, joint swelling, arthralgias and gait problem.  Skin: Negative for rash.  Neurological: Positive for weakness, light-headedness and headaches. Negative for dizziness, syncope, speech difficulty and numbness.  Hematological: Negative for adenopathy. Does not bruise/bleed easily.  Psychiatric/Behavioral: Negative for behavioral problems and dysphoric mood. The patient is not nervous/anxious.        Objective:   Physical Exam  Constitutional: He is oriented to person, place, and time. He appears well-developed.  HENT:  Head: Normocephalic.  Right Ear: External ear normal.  Left Ear: External ear normal.       Oropharynx slightly injected with pharyngeal crowding  Eyes: Conjunctivae and EOM are normal.  Neck: Normal range of motion.  Cardiovascular: Normal rate, regular rhythm and normal heart sounds.   Pulmonary/Chest: Effort normal and breath sounds normal. No respiratory distress. He has no wheezes. He has no rales.  Abdominal: Bowel sounds are normal.  Musculoskeletal: Normal range of motion. He exhibits no edema and  no tenderness.  Lymphadenopathy:    He has no cervical adenopathy.  Neurological: He is alert and oriented to person, place, and time.  Psychiatric: He has a normal mood and affect. His behavior is normal.          Assessment & Plan:   Viral URI. Will treat symptomatically Dyslipidemia. Patient has self discontinued simvastatin. We'll discuss with PCP at the time of his next ROV

## 2012-02-02 NOTE — Patient Instructions (Signed)
Get plenty of rest, Drink lots of  clear liquids, and use Tylenol or ibuprofen for fever and discomfort.    Mucinex DM twice daily  If cough becomes significantly worse,  obtain prescription for more potent Cough suppressant

## 2012-06-13 ENCOUNTER — Encounter: Payer: Self-pay | Admitting: Internal Medicine

## 2012-06-13 ENCOUNTER — Ambulatory Visit (INDEPENDENT_AMBULATORY_CARE_PROVIDER_SITE_OTHER): Payer: Federal, State, Local not specified - PPO | Admitting: Internal Medicine

## 2012-06-13 VITALS — BP 122/78 | Temp 97.8°F | Wt 173.0 lb

## 2012-06-13 DIAGNOSIS — J029 Acute pharyngitis, unspecified: Secondary | ICD-10-CM | POA: Insufficient documentation

## 2012-06-13 NOTE — Patient Instructions (Addendum)
Gargle with warm salt water twice daily Use ibuprofen 400-600 mg twice daily as needed Increase fluid intake. Please call our office if your symptoms do not improve or gets worse.

## 2012-06-13 NOTE — Assessment & Plan Note (Signed)
51 year old [place] American male with acute pharyngitis. He is negative for fever, tonsillary exudates, or neck tenderness. Rapid strep is negative. Symptomatic treatment discussed.  Patient advised to call office if symptoms persist or worsen.

## 2012-06-13 NOTE — Progress Notes (Signed)
  Subjective:    Patient ID: Derek Murray, male    DOB: May 26, 1961, 51 y.o.   MRN: [id number]  Sore Throat  This is a new problem. The current episode started in the past 7 days. The problem has been unchanged. Neither side of throat is experiencing more pain than the other. There has been no fever. The pain is moderate. Associated symptoms include coughing. Pertinent negatives include no abdominal pain, shortness of breath, trouble swallowing or vomiting. He has tried nothing for the symptoms.      Review of Systems  HENT: Negative for trouble swallowing.   Respiratory: Positive for cough. Negative for shortness of breath.   Gastrointestinal: Negative for vomiting and abdominal pain.   Past Medical History  Diagnosis Date  . Hyperlipidemia   . Hypospadias     congenital hypospadia-surgically corrected  . 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 Umbilical hernia     asymptomatic    History   Social History  . Marital Status: Single    Spouse Name: N/A    Number of Children: N/A  . Years of Education: N/A   Occupational History  . Not on file.   Social History Main Topics  . Smoking status: Former Games developer  . Smokeless tobacco: Never Used  . Alcohol Use: No  . Drug Use: No  . Sexually Active: Not on file   Other Topics Concern  . 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 Hypospadias correction     Family History  Problem Relation Age of Onset  . Diabetes    . Hypertension    . Prostate cancer    . Stroke      No Known Allergies  Current Outpatient Prescriptions on File Prior to Visit  Medication Sig Dispense Refill  . simvastatin (ZOCOR) 20 MG tablet Take 1 tablet (20 mg total) by mouth at bedtime. Office  visit is due for future refills  90 tablet  3    BP 122/78  Temp 97.8 F (36.6 C) (Oral)  Wt 173 lb (78.472 kg)        Objective:   Physical Exam  Constitutional: He appears well-developed and well-nourished.  HENT:  Head: Normocephalic and atraumatic.  Right Ear: External ear normal.  Left Ear: External ear normal.  Mouth/Throat: No oropharyngeal exudate.       Oropharyngeal erythema  Neck: Neck supple.  Cardiovascular: Normal rate, regular rhythm and normal heart sounds.   Pulmonary/Chest: Effort normal and breath sounds normal. He has no wheezes. He has no rales.  Lymphadenopathy:    He has no cervical adenopathy.          Assessment & Plan:

## 2012-06-18 ENCOUNTER — Encounter: Payer: Self-pay | Admitting: Internal Medicine

## 2012-06-18 ENCOUNTER — Ambulatory Visit (INDEPENDENT_AMBULATORY_CARE_PROVIDER_SITE_OTHER): Payer: Federal, State, Local not specified - PPO | Admitting: Internal Medicine

## 2012-06-18 VITALS — BP 126/88 | Temp 98.7°F | Wt 174.0 lb

## 2012-06-18 DIAGNOSIS — J029 Acute pharyngitis, unspecified: Secondary | ICD-10-CM

## 2012-06-18 MED ORDER — AMOXICILLIN 875 MG PO TABS: 875.0000 mg | ORAL_TABLET | Freq: Two times a day (BID) | ORAL | Status: AC

## 2012-06-18 NOTE — Assessment & Plan Note (Signed)
Patient experiencing persistent symptoms (8-9 days) despite supportive care. He now has purulent cough with yellowish-green discharge. Possible sinusitis. Treat with amoxicillin 875 mg twice a day x10 days.  Patient advised to call office if symptoms persist or worsen.

## 2012-06-18 NOTE — Patient Instructions (Addendum)
Please call our office if your symptoms do not improve or gets worse.  

## 2012-06-18 NOTE — Progress Notes (Signed)
  Subjective:    Patient ID: Derek Murray, male    DOB: May 20, 1961, 51 y.o.   MRN: [id number]  HPI  51 year old African American male previously seen for sore throat and URI symptoms for followup. Patient reports symptoms have not improved.  In fact he now has productive cough that's yellowish and greenish in color. He has persistent sore throat and mild headache. This is going on day 8 - 9 of symptoms. He denies fever chills area and he denies shortness of breath.  Review of Systems See HPI  Past Medical History  Diagnosis Date  . Hyperlipidemia   . Hypospadias     congenital hypospadia-surgically corrected  . 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 Umbilical hernia     asymptomatic    History   Social History  . Marital Status: Single    Spouse Name: N/A    Number of Children: N/A  . Years of Education: N/A   Occupational History  . Not on file.   Social History Main Topics  . Smoking status: Former Games developer  . Smokeless tobacco: Never Used  . Alcohol Use: No  . Drug Use: No  . Sexually Active: Not on file   Other Topics Concern  . 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 Hypospadias correction     Family History  Problem Relation Age of Onset  . Diabetes    . Hypertension    . Prostate cancer    . Stroke      No Known Allergies  Current Outpatient Prescriptions on File Prior to Visit  Medication Sig Dispense Refill  . simvastatin (ZOCOR) 20 MG tablet Take 1 tablet (20 mg total) by mouth at bedtime. Office visit is due for future refills  90 tablet  3    BP 126/88  Temp 98.7 F (37.1 C) (Oral)  Wt 174 lb (78.926 kg)       Objective:   Physical Exam  Constitutional: He is oriented to person,  place, and time. He appears well-developed and well-nourished.  HENT:  Head: Normocephalic and atraumatic.  Right Ear: External ear normal.  Left Ear: External ear normal.  Mouth/Throat: No oropharyngeal exudate.       Oropharyngeal erythema  Eyes: EOM are normal. Pupils are equal, round, and reactive to light.  Neck: Neck supple.  Cardiovascular: Normal rate and regular rhythm.   Pulmonary/Chest: Breath sounds normal. He has no wheezes.  Lymphadenopathy:    He has no cervical adenopathy.  Neurological: He is alert and oriented to person, place, and time.  Skin: Skin is warm and dry.       Assessment & Plan:

## 2012-07-13 ENCOUNTER — Ambulatory Visit (INDEPENDENT_AMBULATORY_CARE_PROVIDER_SITE_OTHER): Payer: Federal, State, Local not specified - PPO | Admitting: Internal Medicine

## 2012-07-13 ENCOUNTER — Encounter: Payer: Self-pay | Admitting: Internal Medicine

## 2012-07-13 VITALS — BP 122/76 | Temp 103.0°F | Wt 172.0 lb

## 2012-07-13 DIAGNOSIS — R350 Frequency of micturition: Secondary | ICD-10-CM

## 2012-07-13 DIAGNOSIS — N390 Urinary tract infection, site not specified: Secondary | ICD-10-CM | POA: Insufficient documentation

## 2012-07-13 LAB — POCT URINALYSIS DIPSTICK
Glucose, UA: NEGATIVE
Nitrite, UA: POSITIVE
Urobilinogen, UA: 2

## 2012-07-13 MED ORDER — PROMETHAZINE HCL 12.5 MG PO TABS: 12.5000 mg | ORAL_TABLET | Freq: Three times a day (TID) | ORAL | Status: DC | PRN

## 2012-07-13 MED ORDER — LEVOFLOXACIN 500 MG PO TABS: 500.0000 mg | ORAL_TABLET | Freq: Every day | ORAL | Status: DC

## 2012-07-13 NOTE — Addendum Note (Signed)
Addended by: Rita Ohara R on: 07/13/2012 02:30 PM   Modules accepted: Orders

## 2012-07-13 NOTE — Assessment & Plan Note (Signed)
51 year old [place] American male with UTI. He has associated significant fever. He has mild tachycardia. No hypotension. Patient advised increase fluids. Treat with Levaquin 500 mg once daily for 10 days. We discussed the possibility of urosepsis. The symptoms do not improve over the next 24-36 hours patient understands to proceed to emergency room evaluation and treatment.  UA - positive blood 2+, nitrate positive, leukocyte 2+ send urine culture

## 2012-07-13 NOTE — Patient Instructions (Addendum)
If your fever and dizziness does not improve, seek emergency medical care.

## 2012-07-13 NOTE — Progress Notes (Signed)
  Subjective:    Patient ID: Derek Murray, male    DOB: 11/25/1961, 51 y.o.   MRN: [id number]  HPI  51 year old African American male complains of chills, fever over last 24-48 hours. He also describes intermittent dizziness. He felt nauseated last night but did not vomit. Patient is also experienced urinary frequency and urgency. He has mild low back pain but denies any flank pain or tenderness.  Review of Systems Fever and chills, no shortness of breath  Past Medical History  Diagnosis Date  . Hyperlipidemia   . Hypospadias     congenital hypospadia-surgically corrected  . 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 Umbilical hernia     asymptomatic    History   Social History  . Marital Status: Single    Spouse Name: N/A    Number of Children: N/A  . Years of Education: N/A   Occupational History  . Not on file.   Social History Main Topics  . Smoking status: Former Games developer  . Smokeless tobacco: Never Used  . Alcohol Use: No  . Drug Use: No  . Sexually Active: Not on file   Other Topics Concern  . 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 Hypospadias correction     Family History  Problem Relation Age of Onset  . Diabetes    . Hypertension    . Prostate cancer    . Stroke      No Known Allergies  Current Outpatient Prescriptions on File Prior to Visit  Medication Sig Dispense Refill  . promethazine (PHENERGAN) 12.5 MG tablet Take 1 tablet (12.5 mg total) by mouth every 8 (eight) hours as needed for nausea.  30 tablet  0  . simvastatin (ZOCOR) 20 MG tablet Take 1 tablet (20 mg total) by mouth at bedtime. Office visit is due for future refills  90 tablet  3    BP 122/76  Temp 103 F (39.4 C) (Oral)  Wt  172 lb (78.019 kg)       Objective:   Physical Exam  Constitutional: He appears well-developed and well-nourished.  HENT:  Head: Normocephalic and atraumatic.  Mouth/Throat: Oropharynx is clear and moist.  Cardiovascular: Normal rate, regular rhythm and normal heart sounds.   Pulmonary/Chest: Effort normal and breath sounds normal. He has no wheezes.  Abdominal: Soft. Bowel sounds are normal. He exhibits no mass.       No flank tenderness  Skin: Skin is warm and dry.          Assessment & Plan:

## 2012-07-15 ENCOUNTER — Emergency Department (HOSPITAL_BASED_OUTPATIENT_CLINIC_OR_DEPARTMENT_OTHER)
Admission: EM | Admit: 2012-07-15 | Discharge: 2012-07-15 | Disposition: A | Discharge status: Home / Self Care | Payer: Federal, State, Local not specified - PPO | Attending: Emergency Medicine | Admitting: Emergency Medicine

## 2012-07-15 ENCOUNTER — Encounter (HOSPITAL_BASED_OUTPATIENT_CLINIC_OR_DEPARTMENT_OTHER): Payer: Self-pay | Admitting: *Deleted

## 2012-07-15 ENCOUNTER — Emergency Department (HOSPITAL_BASED_OUTPATIENT_CLINIC_OR_DEPARTMENT_OTHER): Payer: Federal, State, Local not specified - PPO

## 2012-07-15 DIAGNOSIS — N390 Urinary tract infection, site not specified: Secondary | ICD-10-CM

## 2012-07-15 DIAGNOSIS — E785 Hyperlipidemia, unspecified: Secondary | ICD-10-CM | POA: Insufficient documentation

## 2012-07-15 DIAGNOSIS — Z87891 Personal history of nicotine dependence: Secondary | ICD-10-CM | POA: Insufficient documentation

## 2012-07-15 DIAGNOSIS — R110 Nausea: Secondary | ICD-10-CM | POA: Insufficient documentation

## 2012-07-15 DIAGNOSIS — R509 Fever, unspecified: Secondary | ICD-10-CM

## 2012-07-15 LAB — COMPREHENSIVE METABOLIC PANEL
ALT: 30 U/L (ref 0–53)
AST: 24 U/L (ref 0–37)
Albumin: 3.6 g/dL (ref 3.5–5.2)
Alkaline Phosphatase: 80 U/L (ref 39–117)
Calcium: 9.7 mg/dL (ref 8.4–10.5)
Potassium: 4.1 mEq/L (ref 3.5–5.1)
Sodium: 132 mEq/L — ABNORMAL LOW (ref 135–145)
Total Protein: 7.5 g/dL (ref 6.0–8.3)

## 2012-07-15 LAB — LACTIC ACID, PLASMA: Lactic Acid, Venous: 1.6 mmol/L (ref 0.5–2.2)

## 2012-07-15 LAB — URINALYSIS, ROUTINE W REFLEX MICROSCOPIC
Bilirubin Urine: NEGATIVE
Glucose, UA: NEGATIVE mg/dL
Nitrite: NEGATIVE
Specific Gravity, Urine: 1.013 (ref 1.005–1.030)
pH: 6 (ref 5.0–8.0)

## 2012-07-15 LAB — CBC WITH DIFFERENTIAL/PLATELET
Basophils Absolute: 0 10*3/uL (ref 0.0–0.1)
Eosinophils Absolute: 0 10*3/uL (ref 0.0–0.7)
Eosinophils Relative: 0 % (ref 0–5)
MCH: 30.6 pg (ref 26.0–34.0)
MCV: 89.8 fL (ref 78.0–100.0)
Neutrophils Relative %: 85 % — ABNORMAL HIGH (ref 43–77)
Platelets: 132 10*3/uL — ABNORMAL LOW (ref 150–400)
RDW: 11.7 % (ref 11.5–15.5)
WBC: 7.3 10*3/uL (ref 4.0–10.5)

## 2012-07-15 LAB — URINE MICROSCOPIC-ADD ON

## 2012-07-15 MED ORDER — ACETAMINOPHEN 325 MG PO TABS
650.0000 mg | ORAL_TABLET | Freq: Once | ORAL | Status: AC
  Administered 2012-07-15: 650 mg via ORAL
  Filled 2012-07-15: qty 2

## 2012-07-15 MED ORDER — DEXTROSE 5 % IV SOLN
1.0000 g | Freq: Once | INTRAVENOUS | Status: AC
  Administered 2012-07-15: 1 g via INTRAVENOUS
  Filled 2012-07-15: qty 10

## 2012-07-15 MED ORDER — SULFAMETHOXAZOLE-TRIMETHOPRIM 800-160 MG PO TABS: 1.0000 | ORAL_TABLET | Freq: Two times a day (BID) | ORAL | Status: DC

## 2012-07-15 MED ORDER — SODIUM CHLORIDE 0.9 % IV BOLUS (SEPSIS)
1000.0000 mL | Freq: Once | INTRAVENOUS | Status: AC
  Administered 2012-07-15: 1000 mL via INTRAVENOUS

## 2012-07-15 NOTE — ED Notes (Signed)
Pt reports fever cough and nausea since Wednesday- saw his PCP on Friday-

## 2012-07-15 NOTE — ED Provider Notes (Signed)
History     CSN: [id number]  Arrival date & time 07/15/12  1358   First MD Initiated Contact with Patient 07/15/12 1417      Chief Complaint  Patient presents with  . Fever  . Nausea    (Consider location/radiation/quality/duration/timing/severity/associated sxs/prior treatment) HPI Patient is a 51 year old male who presents today complaining of onset of fever last night with presenting temperature of 102.9. Patient endorses some nausea as well as cough for the past 5 days. He saw his primary care doctor, Dr. Artist Pais, 2 days ago. At that time he was diagnosed with a UTI and started on Levaquin. Patient has a history of hypospadias with what sounds like some urinary reflux but has been evaluated by urology in the past. Patient endorses mild suprapubic pain. He has taken 3 doses of the Levaquin he was started on. Urine culture does show E. coli but no sensitivities as yet from urine sample collected 2 days ago. Patient has surgical history significant for repair of hypospadias at birth. Patient was given Tylenol upon arrival to the ED but had not taken any other medications for fever today. Patient denies any chest pain or shortness of breath. There are no other associated or modifying factors. Past Medical History  Diagnosis Date  . Hyperlipidemia   . Hypospadias     congenital hypospadia-surgically corrected  . 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 Umbilical hernia     asymptomatic    Past Surgical History  Procedure Date  . Hypospadias correction   . Eye surgery     Family History  Problem Relation Age of Onset  . Diabetes    . Hypertension    . Prostate cancer    . Stroke      History  Substance Use Topics  . Smoking status: Former Games developer  . Smokeless tobacco: Never Used  . Alcohol Use: No      Review of Systems  Constitutional: Positive for fever.  HENT: Negative.   Eyes:  Negative.   Respiratory: Positive for cough.   Cardiovascular: Negative.   Gastrointestinal: Positive for nausea and abdominal pain.  Musculoskeletal: Negative.   Skin: Negative.   Hematological: Negative.   Psychiatric/Behavioral: Negative.   All other systems reviewed and are negative.    Allergies  Review of patient's allergies indicates no known allergies.  Home Medications   Current Outpatient Rx  Name Route Sig Dispense Refill  . LEVOFLOXACIN 500 MG PO TABS Oral Take 1 tablet (500 mg total) by mouth daily. 10 tablet 0  . PROMETHAZINE HCL 12.5 MG PO TABS Oral Take 1 tablet (12.5 mg total) by mouth every 8 (eight) hours as needed for nausea. 30 tablet 0  . SIMVASTATIN 20 MG PO TABS Oral Take 1 tablet (20 mg total) by mouth at bedtime. Office visit is due for future refills 90 tablet 3    BP 133/69  Pulse 92  Temp 102.9 F (39.4 C) (Oral)  Resp 18  Ht 5\' 7"  (1.702 m)  Wt 173 lb (78.472 kg)  BMI 27.10 kg/m2  SpO2 98%  Physical Exam  Nursing note and vitals reviewed. GEN: Well-developed, well-nourished male in no distress HEENT: Atraumatic, normocephalic. Oropharynx clear without erythema EYES: PERRLA BL, no scleral icterus. NECK: Trachea midline, no meningismus CV: regular rate and rhythm. No murmurs, rubs, or gallops PULM: No respiratory distress.  No crackles, wheezes, or rales. GI: soft, mild suprapubic tenderness  to palpation. No guarding, rebound. + bowel sounds  GU: deferred Neuro: cranial nerves grossly 2-12 intact, no abnormalities of strength or sensation, A and O x 3 MSK: Patient moves all 4 extremities symmetrically, no deformity, edema, or injury noted Skin: No rashes petechiae, purpura, or jaundice Psych: no abnormality of mood   ED Course  Procedures (including critical care time)   Labs Reviewed  CBC WITH DIFFERENTIAL  COMPREHENSIVE METABOLIC PANEL  CULTURE, BLOOD (ROUTINE X 2)  CULTURE, BLOOD (ROUTINE X 2)  LACTIC ACID, PLASMA  URINALYSIS,  ROUTINE W REFLEX MICROSCOPIC  URINE CULTURE  LIPASE, BLOOD   No results found.   No diagnosis found.    MDM  Patient was evaluated by myself. Though he was febrile to 102.9 he was neither tachycardic nor hypotensive. Patient has been taking Levaquin for his urinary tract infection which should cover the Escherichia coli that was noted on his urine culture from 2 days ago. Patient has continued to have cough and nausea. He denied any other GI symptoms. Chest x-ray as well as complete sepsis workup was performed. Patient was given IV fluids and Tylenol 650 mg by mouth. Studies are pending at this time.  CXR was negative and patient had no leukocytosis or anemia.  Care signed out to oncoming attending at this time.          Cyndra Numbers, MD 07/15/12 1553

## 2012-07-15 NOTE — ED Provider Notes (Addendum)
Assumed care from Dr Alto Denver in sign out. UA still suggestive of infection despite several doses of levaquin. Unfortunately culture sensitivities still pending. Discussed with pt changing abx for presumed resistance and close outpt f/u with Dr Artist Pais versus admission. Pt would prefer outpt follow-up with I think is reasonable. Pt's hx of what sounds like reflux may be complicating this. Recommended FU with his urologist as well. Work-up is otherwise reassuring. Pt is well appearing on my exam and states he is feeling better prior to DC. Will change abx to Bactrim. Return precautions discussed, otherwise close outpt fu.  Raeford Razor, MD 07/15/12 1734  Raeford Razor, MD 07/20/12 [phone]

## 2012-07-16 LAB — URINE CULTURE: Colony Count: >=100000

## 2012-07-17 LAB — URINE CULTURE: Colony Count: NO GROWTH

## 2012-07-20 ENCOUNTER — Ambulatory Visit (INDEPENDENT_AMBULATORY_CARE_PROVIDER_SITE_OTHER): Payer: Federal, State, Local not specified - PPO | Admitting: Internal Medicine

## 2012-07-20 ENCOUNTER — Encounter: Payer: Self-pay | Admitting: Internal Medicine

## 2012-07-20 VITALS — BP 112/80 | HR 59 | Temp 98.4°F | Resp 16 | Ht 66.5 in | Wt 169.0 lb

## 2012-07-20 DIAGNOSIS — N390 Urinary tract infection, site not specified: Secondary | ICD-10-CM

## 2012-07-20 MED ORDER — LEVOFLOXACIN 500 MG PO TABS: 500.0000 mg | ORAL_TABLET | Freq: Every day | ORAL | Status: AC

## 2012-07-20 NOTE — Assessment & Plan Note (Signed)
Patient's symptoms have significantly improved. He was seen on 07/15/2012 in ER. Blood cultures are negative. Urine culture was positive for Escherichia coli that was resistant to Bactrim. He has been taking Levaquin 500 mg once daily.   He is on day 7. Patient reports having mild chills and sweats. His prostate gland is mildly tender. He may have prostatitis. I suggest he complete total of 3 weeks of Levaquin. Reassess in 2 weeks

## 2012-07-20 NOTE — Progress Notes (Signed)
  Subjective:    Patient ID: Derek Murray, male    DOB: Mar 28, 1961, 51 y.o.   MRN: [id number]  HPI  51 year old [place] American male for followup regarding UTI and possible urosepsis. After previous office visit patient was seen in emergency room. Blood cultures were negative. Our urine culture showed Escherichia coli that was resistant to ampicillin and Bactrim. Patient is continuing course of Levaquin 500 mg once daily. He is on day 7. Overall he is feeling much better, however he is still experiencing intermittent chills and diaphoresis.  Review of Systems Negative for dysuria or urinary frequency Negative for flank discomfort.  Past Medical History  Diagnosis Date  . Hyperlipidemia   . Hypospadias     congenital hypospadia-surgically corrected  . 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 Umbilical hernia     asymptomatic    History   Social History  . Marital Status: Single    Spouse Name: N/A    Number of Children: N/A  . Years of Education: N/A   Occupational History  . Not on file.   Social History Main Topics  . Smoking status: Former Games developer  . Smokeless tobacco: Never Used  . Alcohol Use: No  . Drug Use: No  . Sexually Active: Not on file   Other Topics Concern  . 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 Hypospadias correction   . Eye surgery     Family History  Problem Relation Age of Onset  . Diabetes    . Hypertension    . Prostate cancer    . Stroke      No Known Allergies  Current Outpatient Prescriptions on File Prior to Visit  Medication Sig Dispense Refill  . promethazine (PHENERGAN) 12.5 MG tablet Take 1 tablet (12.5 mg total) by mouth every 8 (eight) hours as needed for nausea.  30  tablet  0  . simvastatin (ZOCOR) 20 MG tablet Take 1 tablet (20 mg total) by mouth at bedtime. Office visit is due for future refills  90 tablet  3    BP 112/80  Pulse 59  Temp 98.4 F (36.9 C) (Oral)  Resp 16  Ht 5' 6.5" (1.689 m)  Wt 169 lb (76.658 kg)  BMI 26.87 kg/m2  SpO2 98%       Objective:   Physical Exam  Constitutional: He appears well-developed and well-nourished.  HENT:       Slight white film on tongue  Cardiovascular: Normal rate, regular rhythm and normal heart sounds.   Pulmonary/Chest: Effort normal and breath sounds normal. He has no wheezes.  Abdominal: Soft. Bowel sounds are normal. There is no tenderness.  Genitourinary: Rectum normal. Penile tenderness present.       Mild bogginess of prostate gland with tenderness       Assessment & Plan:

## 2012-07-21 LAB — CULTURE, BLOOD (ROUTINE X 2): Culture: NO GROWTH

## 2012-08-03 ENCOUNTER — Ambulatory Visit (INDEPENDENT_AMBULATORY_CARE_PROVIDER_SITE_OTHER): Payer: Federal, State, Local not specified - PPO | Admitting: Internal Medicine

## 2012-08-03 ENCOUNTER — Encounter: Payer: Self-pay | Admitting: Internal Medicine

## 2012-08-03 VITALS — BP 122/82 | HR 68 | Temp 98.1°F | Wt 172.0 lb

## 2012-08-03 DIAGNOSIS — N390 Urinary tract infection, site not specified: Secondary | ICD-10-CM

## 2012-08-03 DIAGNOSIS — Z0000 Encounter for general adult medical examination without abnormal findings: Secondary | ICD-10-CM

## 2012-08-03 NOTE — Patient Instructions (Addendum)
Please complete the following lab tests before your next follow up appointment: LFT's, fasting lipid panel - 272.4

## 2012-08-03 NOTE — Progress Notes (Signed)
  Subjective:    Patient ID: Derek Murray, male    DOB: 06-Mar-1961, 51 y.o.   MRN: [id number]  HPI  51 year old [place] American male previously seen for UTI and possible prostatitis for followup. Patient finished full course of Levaquin 500 mg once daily for 3 weeks. His urinary symptoms have completely resolved. He denies chills or diaphoresis.  Over the years he has gotten several UTIs. He denies any symptoms of BPH. He does have remote history of hypospadias which was surgically corrected. He reports he "sprays" his urine but urine stream is fairly strong.  Review of Systems See HPI  Past Medical History  Diagnosis Date  . Hyperlipidemia   . Hypospadias     congenital hypospadia-surgically corrected  . 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 Umbilical hernia     asymptomatic    History   Social History  . Marital Status: Single    Spouse Name: N/A    Number of Children: N/A  . Years of Education: N/A   Occupational History  . Not on file.   Social History Main Topics  . Smoking status: Former Games developer  . Smokeless tobacco: Never Used  . Alcohol Use: No  . Drug Use: No  . Sexually Active: Not on file   Other Topics Concern  . 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 Hypospadias correction   . Eye surgery     Family History  Problem Relation Age of Onset  . Diabetes    . Hypertension    . Prostate cancer    . Stroke      No Known Allergies  Current Outpatient Prescriptions on File Prior to Visit  Medication Sig Dispense Refill  . simvastatin (ZOCOR) 20 MG tablet Take 1 tablet (20 mg total) by mouth at bedtime. Office visit is due for future refills  90 tablet  3    BP 122/82  Pulse 68  Temp 98.1 F  (36.7 C) (Oral)  Wt 172 lb (78.019 kg)       Objective:   Physical Exam  Constitutional: He appears well-developed and well-nourished.  Cardiovascular: Normal rate, regular rhythm and normal heart sounds.   Pulmonary/Chest: Effort normal and breath sounds normal.  Abdominal: Soft. Bowel sounds are normal. There is no tenderness.        Assessment & Plan:

## 2012-08-03 NOTE — Assessment & Plan Note (Signed)
Symptoms completely resolved with 3 week course of Levaquin 500 mg once daily. We discussed possible urologic referral for evaluation of recurrent UTIs. Unclear whether his history of hypospadias correction is contributing to his risk for recurrent UTIs.  He defers referral for now.

## 2012-09-28 ENCOUNTER — Encounter: Payer: Federal, State, Local not specified - PPO | Admitting: Internal Medicine

## 2012-12-19 HISTORY — PX: COLONOSCOPY: SHX174

## 2013-02-08 ENCOUNTER — Other Ambulatory Visit (INDEPENDENT_AMBULATORY_CARE_PROVIDER_SITE_OTHER): Payer: Federal, State, Local not specified - PPO

## 2013-02-08 ENCOUNTER — Ambulatory Visit: Payer: Federal, State, Local not specified - PPO | Admitting: Internal Medicine

## 2013-02-08 ENCOUNTER — Other Ambulatory Visit: Payer: Federal, State, Local not specified - PPO

## 2013-02-08 DIAGNOSIS — E785 Hyperlipidemia, unspecified: Secondary | ICD-10-CM

## 2013-02-08 LAB — LIPID PANEL
HDL: 34.6 mg/dL — ABNORMAL LOW (ref >39.00)
Triglycerides: 180 mg/dL — ABNORMAL HIGH (ref 0.0–149.0)

## 2013-02-08 LAB — HEPATIC FUNCTION PANEL
ALT: 26 U/L (ref 0–53)
Total Bilirubin: 1.7 mg/dL — ABNORMAL HIGH (ref 0.3–1.2)
Total Protein: 7.6 g/dL (ref 6.0–8.3)

## 2013-02-08 LAB — LDL CHOLESTEROL, DIRECT: Direct LDL: 141.3 mg/dL

## 2013-02-15 ENCOUNTER — Ambulatory Visit (INDEPENDENT_AMBULATORY_CARE_PROVIDER_SITE_OTHER): Payer: Federal, State, Local not specified - PPO | Admitting: Internal Medicine

## 2013-02-15 ENCOUNTER — Encounter: Payer: Self-pay | Admitting: Internal Medicine

## 2013-02-15 VITALS — BP 142/84 | HR 68 | Temp 97.9°F | Ht 67.0 in | Wt 180.0 lb

## 2013-02-15 DIAGNOSIS — D126 Benign neoplasm of colon, unspecified: Secondary | ICD-10-CM

## 2013-02-15 DIAGNOSIS — E785 Hyperlipidemia, unspecified: Secondary | ICD-10-CM

## 2013-02-15 MED ORDER — SIMVASTATIN 10 MG PO TABS: 10.0000 mg | ORAL_TABLET | Freq: Every day | ORAL | Status: DC

## 2013-02-15 NOTE — Assessment & Plan Note (Signed)
Patient encouraged to restart simvastatin 10 mg once daily. His ten-year cardiovascular risk is 7.7%. I encouraged regular exercise and low saturated fat diet. Repeat lipid panel and LFTs in 6 months.

## 2013-02-15 NOTE — Assessment & Plan Note (Signed)
Patient had colonoscopy at [facility] 2 - 3 years ago. Colon polyps were found. He was advised to return in 2-3 years for repeat colonoscopy.Derek Murray He does not want to return to [facility] and requests  referral to local gastroenterologist.  Refer to Dr. Christella Hartigan.  Obtain previous colonoscopy and pathology records.

## 2013-02-15 NOTE — Progress Notes (Signed)
  Subjective:    Patient ID: Derek Murray, male    DOB: November 21, 1961, 52 y.o.   MRN: [id number]  HPI  52 year old [place] American male with history of hyperlipidemia for routine followup. It has been several months since he discontinued simvastatin. He denies any side effects. He just decided to stop medication on his own. He is somewhat concerned about long-term side effects. He has never had any abnormal LFTs or any muscle symptoms from statin medication.  Patient's 10 year cardiovascular risk is 7.7%.  Review of Systems Mild weight gain.  He follows low fat diet.  He does not exercise on regular basis  Past Medical History  Diagnosis Date  . Hyperlipidemia   . Hypospadias     congenital hypospadia-surgically corrected  . 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 Umbilical hernia     asymptomatic    History   Social History  . Marital Status: Single    Spouse Name: N/A    Number of Children: N/A  . Years of Education: N/A   Occupational History  . Not on file.   Social History Main Topics  . Smoking status: Former Games developer  . Smokeless tobacco: Never Used  . Alcohol Use: No  . Drug Use: No  . Sexually Active: Not on file   Other Topics Concern  . 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 Hypospadias correction    . Eye surgery      Family History  Problem Relation Age of Onset  . Diabetes    . Hypertension    . Prostate cancer    . Stroke      No Known Allergies  No current outpatient prescriptions on file prior to visit.   No current facility-administered medications on file prior to visit.    BP 142/84  Pulse 68  Temp(Src) 97.9 F (36.6 C) (Oral)  Ht 5\' 7"  (1.702 m)   Wt 180 lb (81.647 kg)  BMI 28.19 kg/m2       Objective:   Physical Exam  Constitutional: He appears well-developed and well-nourished.  Neck:  No carotid bruit  Cardiovascular: Normal rate, regular rhythm and normal heart sounds.   Pulmonary/Chest: Effort normal. He has no wheezes.          Assessment & Plan:

## 2013-02-15 NOTE — Patient Instructions (Addendum)
Please complete the following lab tests before your next follow up appointment: FLP, LFTs, TSH - 272.4 

## 2013-03-05 ENCOUNTER — Encounter: Payer: Self-pay | Admitting: Gastroenterology

## 2013-03-27 ENCOUNTER — Other Ambulatory Visit: Payer: Self-pay | Admitting: Hematology & Oncology

## 2013-04-17 ENCOUNTER — Encounter: Payer: Federal, State, Local not specified - PPO | Admitting: Gastroenterology

## 2013-04-22 ENCOUNTER — Ambulatory Visit (AMBULATORY_SURGERY_CENTER): Payer: Federal, State, Local not specified - PPO | Admitting: *Deleted

## 2013-04-22 VITALS — Ht 66.0 in | Wt 177.8 lb

## 2013-04-22 DIAGNOSIS — Z1211 Encounter for screening for malignant neoplasm of colon: Secondary | ICD-10-CM

## 2013-04-22 MED ORDER — PEG-KCL-NACL-NASULF-NA ASC-C 100 G PO SOLR: ORAL | Status: DC

## 2013-04-22 NOTE — Progress Notes (Signed)
Release of info filled out and given to [employer] CMA. Pt states he had a colonoscopy with Dr. Vonita Moss at [facility] several years ago

## 2013-04-23 ENCOUNTER — Encounter: Payer: Self-pay | Admitting: Gastroenterology

## 2013-05-01 ENCOUNTER — Encounter: Payer: Self-pay | Admitting: Gastroenterology

## 2013-05-01 ENCOUNTER — Ambulatory Visit (AMBULATORY_SURGERY_CENTER): Payer: Federal, State, Local not specified - PPO | Admitting: Gastroenterology

## 2013-05-01 VITALS — BP 114/72 | HR 62 | Temp 97.5°F | Resp 19 | Ht 66.0 in | Wt 177.0 lb

## 2013-05-01 DIAGNOSIS — D126 Benign neoplasm of colon, unspecified: Secondary | ICD-10-CM

## 2013-05-01 DIAGNOSIS — Z86010 Personal history of colonic polyps: Secondary | ICD-10-CM

## 2013-05-01 DIAGNOSIS — Z1211 Encounter for screening for malignant neoplasm of colon: Secondary | ICD-10-CM

## 2013-05-01 MED ORDER — SODIUM CHLORIDE 0.9 % IV SOLN: 500.0000 mL | INTRAVENOUS | Status: DC

## 2013-05-01 NOTE — Patient Instructions (Addendum)
YOU HAD AN ENDOSCOPIC PROCEDURE TODAY AT THE [facility]: Refer to the procedure report that was given to you for any specific questions about what was found during the examination.  If the procedure report does not answer your questions, please call your gastroenterologist to clarify.  If you requested that your care partner not be given the details of your procedure findings, then the procedure report has been included in a sealed envelope for you to review at your convenience later.  YOU SHOULD EXPECT: Some feelings of bloating in the abdomen. Passage of more gas than usual.  Walking can help get rid of the air that was put into your GI tract during the procedure and reduce the bloating. If you had a lower endoscopy (such as a colonoscopy or flexible sigmoidoscopy) you may notice spotting of blood in your stool or on the toilet paper. If you underwent a bowel prep for your procedure, then you may not have a normal bowel movement for a few days.  DIET: Your first meal following the procedure should be a light meal and then it is ok to progress to your normal diet.  A half-sandwich or bowl of soup is an example of a good first meal.  Heavy or fried foods are harder to digest and may make you feel nauseous or bloated.  Likewise meals heavy in dairy and vegetables can cause extra gas to form and this can also increase the bloating.  Drink plenty of fluids but you should avoid alcoholic beverages for 24 hours.  ACTIVITY: Your care partner should take you home directly after the procedure.  You should plan to take it easy, moving slowly for the rest of the day.  You can resume normal activity the day after the procedure however you should NOT DRIVE or use heavy machinery for 24 hours (because of the sedation medicines used during the test).    SYMPTOMS TO REPORT IMMEDIATELY: A gastroenterologist can be reached at any hour.  During normal business hours, 8:30 AM to 5:00 PM Monday through Friday,  call [phone].  After hours and on weekends, please call the GI answering service at [phone] who will take a message and have the physician on call contact you.   Following lower endoscopy (colonoscopy or flexible sigmoidoscopy):  Excessive amounts of blood in the stool  Significant tenderness or worsening of abdominal pains  Swelling of the abdomen that is new, acute  Fever of 100F or higher    FOLLOW UP: If any biopsies were taken you will be contacted by phone or by letter within the next 1-3 weeks.  Call your gastroenterologist if you have not heard about the biopsies in 3 weeks.  Our staff will call the home number listed on your records the next business day following your procedure to check on you and address any questions or concerns that you may have at that time regarding the information given to you following your procedure. This is a courtesy call and so if there is no answer at the home number and we have not heard from you through the emergency physician on call, we will assume that you have returned to your regular daily activities without incident.  SIGNATURES/CONFIDENTIALITY: You and/or your care partner have signed paperwork which will be entered into your electronic medical record.  These signatures attest to the fact that that the information above on your After Visit Summary has been reviewed and is understood.  Full responsibility of the confidentiality   of this discharge information lies with you and/or your care-partner.     

## 2013-05-01 NOTE — Progress Notes (Signed)
Called to room to assist during endoscopic procedure.  Patient ID and intended procedure confirmed with present staff. Received instructions for my participation in the procedure from the performing physician.  

## 2013-05-01 NOTE — Progress Notes (Addendum)
Patient did not have preoperative order for IV antibiotic SSI prophylaxis. [phone])  Patient did not experience any of the following events: a burn prior to discharge; a fall within the facility; wrong site/side/patient/procedure/implant event; or a hospital transfer or hospital admission upon discharge from the facility. [phone])  Patient kept asking for a work note for tomorrow.  Dr. Jarold Motto told him that he would be able to work tomorrow, and that he would not provide a note.

## 2013-05-01 NOTE — Op Note (Signed)
[facility] [address] [place], [address]   COLONOSCOPY PROCEDURE REPORT  PATIENT: Derek Murray, Derek Murray  MR#: [id number] BIRTHDATE: August 21, 1961 , 51  yrs. old GENDER: Male ENDOSCOPIST: Mardella Layman, MD, [facility] REFERRED BY: PROCEDURE DATE:  05/01/2013 PROCEDURE:   Colonoscopy with snare polypectomy ASA CLASS:   Class I INDICATIONS:Patient's personal history of colon polyps. MEDICATIONS: propofol (Diprivan) 300mg  IV  DESCRIPTION OF PROCEDURE:   After the risks and benefits and of the procedure were explained, informed consent was obtained.  A digital rectal exam revealed no abnormalities of the rectum.    The LB [id number]  endoscope was introduced through the anus and advanced to the cecum, which was identified by both the appendix and ileocecal valve .  The quality of the prep was excellent, using MoviPrep .  The instrument was then slowly withdrawn as the colon was fully examined.     COLON FINDINGS: A firm pedunculated polyp ranging between 3-1mm in size was found in the sigmoid colon.  A polypectomy was performed using snare cautery.  The resection was complete and the polyp tissue was completely retrieved.   The colon was otherwise normal. There was no diverticulosis, inflammation, polyps or cancers unless previously stated.     Retroflexed views revealed no abnormalities. The scope was then withdrawn from the patient and the procedure completed.  COMPLICATIONS: There were no complications. ENDOSCOPIC IMPRESSION: 1.   Pedunculated polyp ranging between 3-7mm in size was found in the sigmoid colon; polypectomy was performed using snare cautery 2.   The colon was otherwise normal  RECOMMENDATIONS: 1.  Continue current medications 2.  Repeat Colonoscopy in 5 years.   REPEAT EXAM:  cc:  _______________________________ eSignedMardella Layman, MD, [facility] 05/01/2013 9:12 AM

## 2013-05-02 ENCOUNTER — Telehealth: Payer: Self-pay | Admitting: *Deleted

## 2013-05-02 NOTE — Telephone Encounter (Signed)
Name identifier, left message, follow-up 

## 2013-05-06 ENCOUNTER — Encounter: Payer: Self-pay | Admitting: Gastroenterology

## 2013-10-03 ENCOUNTER — Encounter: Payer: Self-pay | Admitting: Internal Medicine

## 2013-10-03 ENCOUNTER — Ambulatory Visit (INDEPENDENT_AMBULATORY_CARE_PROVIDER_SITE_OTHER): Payer: Federal, State, Local not specified - PPO | Admitting: Internal Medicine

## 2013-10-03 VITALS — BP 114/82 | Temp 98.0°F | Ht 66.0 in | Wt 163.0 lb

## 2013-10-03 DIAGNOSIS — J069 Acute upper respiratory infection, unspecified: Secondary | ICD-10-CM

## 2013-10-03 MED ORDER — HYDROCODONE-HOMATROPINE 5-1.5 MG/5ML PO SYRP: 5.0000 mL | ORAL_SOLUTION | Freq: Three times a day (TID) | ORAL | Status: DC | PRN

## 2013-10-03 NOTE — Assessment & Plan Note (Signed)
52 year old [place] American male with probable viral upper respiratory infection. I recommended symptomatic treatment. Use Hycodan at bedtime for cough.  Patient advised to call office if symptoms persist or worsen.

## 2013-10-03 NOTE — Patient Instructions (Signed)
Gargle with warm salt water 2-3 times per day Use intranasal saline as directed Use Mucinex DM during the day as needed over-the-counter Use prescription cough syrup at bedtime for cough Please contact our office if your symptoms do not improve or gets worse.

## 2013-10-03 NOTE — Progress Notes (Signed)
  Subjective:    Patient ID: Derek Murray, male    DOB: April 12, 1961, 52 y.o.   MRN: [id number]  HPI  52 year old African American male with history of hyperlipidemia complains of intermittent cough and nasal congestion. His symptoms started 6 days ago. He complains of scratchy throat. His cough is intermittently productive of brownish/greenish sputum. He denies fever or chills. He denies any shortness of breath.  Review of Systems Nasal congestion    Past Medical History  Diagnosis Date  . Hyperlipidemia   . Hypospadias     congenital hypospadia-surgically corrected  . 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 Umbilical hernia     asymptomatic    History   Social History  . Marital Status: Single    Spouse Name: N/A    Number of Children: N/A  . Years of Education: N/A   Occupational History  . Not on file.   Social History Main Topics  . Smoking status: Former Games developer  . Smokeless tobacco: Never Used  . Alcohol Use: No  . Drug Use: No  . Sexual Activity: Not on file   Other Topics Concern  . 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 Hypospadias correction    . Eye surgery    . Colonoscopy      Family History  Problem Relation Age of Onset  . Diabetes    . Hypertension    . Prostate cancer    . Stroke    . Colon cancer Paternal Grandfather   . Esophageal cancer Neg Hx   . Stomach cancer Neg Hx   . Rectal cancer Neg Hx     No Known Allergies  Current Outpatient Prescriptions on File Prior to Visit  Medication Sig Dispense Refill  . simvastatin (ZOCOR) 10 MG tablet Take 1 tablet (10 mg total) by mouth at bedtime.  90 tablet  3   No current facility-administered medications  on file prior to visit.    BP 114/82  Temp(Src) 98 F (36.7 C) (Oral)  Ht 5\' 6"  (1.676 m)  Wt 163 lb (73.936 kg)  BMI 26.32 kg/m2    Objective:   Physical Exam  Constitutional: He appears well-developed and well-nourished.  HENT:  Head: Normocephalic and atraumatic.  Right Ear: External ear normal.  Left Ear: External ear normal.  Mouth/Throat: No oropharyngeal exudate.  Mild oropharyngeal erythema  Neck: Neck supple.  No neck tenderness  Cardiovascular: Normal rate, regular rhythm and normal heart sounds.   Pulmonary/Chest: Effort normal and breath sounds normal. He has no wheezes.          Assessment & Plan:

## 2014-02-27 ENCOUNTER — Ambulatory Visit (INDEPENDENT_AMBULATORY_CARE_PROVIDER_SITE_OTHER): Payer: Federal, State, Local not specified - PPO | Admitting: Family Medicine

## 2014-02-27 ENCOUNTER — Encounter: Payer: Self-pay | Admitting: Family Medicine

## 2014-02-27 VITALS — BP 100/78 | HR 71 | Temp 98.0°F | Wt 175.0 lb

## 2014-02-27 DIAGNOSIS — J069 Acute upper respiratory infection, unspecified: Secondary | ICD-10-CM

## 2014-02-27 NOTE — Progress Notes (Signed)
Chief Complaint  Patient presents with  . Cough    sweats and 2 weeks     HPI:  -started 2 weeks ago with nasal congestion, cough, sweats (no fever or chills or body aches) - most has improved bu mildly productive cough remains -denies:fever, SOB, NVD, tooth pain -has tried: nothing -sick contacts/travel/risks: denies flu exposure or Ebola risks -Hx of: no hx of chronic lung dz  ROS: See pertinent positives and negatives per HPI.  Past Medical History  Diagnosis Date  . Hyperlipidemia   . Hypospadias     congenital hypospadia-surgically corrected  . 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 Umbilical hernia     asymptomatic    Past Surgical History  Procedure Laterality Date  . Hypospadias correction    . Eye surgery    . Colonoscopy      Family History  Problem Relation Age of Onset  . Diabetes    . Hypertension    . Prostate cancer    . Stroke    . Colon cancer Paternal Grandfather   . Esophageal cancer Neg Hx   . Stomach cancer Neg Hx   . Rectal cancer Neg Hx     History   Social History  . Marital Status: Single    Spouse Name: N/A    Number of Children: N/A  . Years of Education: N/A   Social History Main Topics  . Smoking status: Former Research scientist (life sciences)  . Smokeless tobacco: Never Used  . Alcohol Use: No  . Drug Use: No  . Sexual Activity: None   Other Topics Concern  . 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  GENERAL: vitals reviewed and listed above, alert, oriented, appears well hydrated and in no acute distress  HEENT: atraumatic, conjunttiva clear, no obvious abnormalities on inspection of external nose and ears, normal appearance of ear canals and TMs, clear nasal congestion, mild post oropharyngeal erythema with PND, no tonsillar edema or exudate, no sinus TTP  NECK: no obvious masses on inspection  LUNGS: clear to auscultation bilaterally, no wheezes, rales or rhonchi, good air movement  CV: HRRR, no peripheral edema  MS: moves all extremities without noticeable abnormality  PSYCH: pleasant and cooperative, no obvious depression or anxiety  ASSESSMENT AND PLAN:  Discussed the following assessment and plan:  Viral upper respiratory illness  -given HPI and exam findings today, a serious infection or illness is unlikely. We discussed potential etiologies, with VURI being most likely, and advised supportive care and monitoring. We discussed treatment side effects, likely course, antibiotic misuse, transmission, and signs of developing a serious illness. -written rx for tussionex provided as computer was down, return precautions -of course, we advised to return or notify a doctor immediately  if symptoms worsen or persist or new concerns arise.    There are no Patient Instructions on file for this visit.   Colin Benton R.

## 2014-02-27 NOTE — Progress Notes (Signed)
Pre visit review using our clinic review tool, if applicable. No additional management support is needed unless otherwise documented below in the visit note. 

## 2014-03-11 ENCOUNTER — Other Ambulatory Visit: Payer: Self-pay | Admitting: Internal Medicine

## 2014-04-18 ENCOUNTER — Other Ambulatory Visit: Payer: Self-pay | Admitting: *Deleted

## 2014-04-18 MED ORDER — SIMVASTATIN 10 MG PO TABS: ORAL_TABLET | ORAL | Status: DC

## 2014-07-14 ENCOUNTER — Ambulatory Visit (INDEPENDENT_AMBULATORY_CARE_PROVIDER_SITE_OTHER): Payer: Federal, State, Local not specified - PPO | Admitting: Family Medicine

## 2014-07-14 ENCOUNTER — Encounter: Payer: Self-pay | Admitting: Family Medicine

## 2014-07-14 VITALS — BP 122/64 | HR 88 | Temp 101.1°F | Wt 166.0 lb

## 2014-07-14 DIAGNOSIS — R339 Retention of urine, unspecified: Secondary | ICD-10-CM

## 2014-07-14 DIAGNOSIS — N419 Inflammatory disease of prostate, unspecified: Secondary | ICD-10-CM | POA: Insufficient documentation

## 2014-07-14 DIAGNOSIS — N410 Acute prostatitis: Secondary | ICD-10-CM

## 2014-07-14 DIAGNOSIS — R509 Fever, unspecified: Secondary | ICD-10-CM

## 2014-07-14 LAB — POCT URINALYSIS DIPSTICK
GLUCOSE UA: NEGATIVE
Nitrite, UA: POSITIVE
Spec Grav, UA: 1.025
Urobilinogen, UA: 1
pH, UA: 5.5

## 2014-07-14 MED ORDER — CIPROFLOXACIN HCL 500 MG PO TABS
500.0000 mg | ORAL_TABLET | Freq: Two times a day (BID) | ORAL | Status: DC
Start: 2014-07-14 — End: 2014-07-24

## 2014-07-14 NOTE — Assessment & Plan Note (Addendum)
Bacterial prostatitis (edematous prostate, obstructive symptoms, febrile) without risk factors for STDs. Doubt urethritis. No dysuria to suggest epididymitis. Possible UTI but think prostatitis more likely.  Patient with similar issue 2 years ago. Discussed case with PCP Dr. Shawna Orleans and decision made to treat patient x 10 days with ciprofloxacin and check in either by phone or in office within a week, if persistent symptoms, would treat x 3 weeks. Discussed with patient I typically treat for 6 weeks but he would prefer to treat as he did in 2013. We discussed potential risks of incomplete eradication of bacteria. In addition, patient declined urology referral at this time for recurrent UTI in patient with hypospadias. Sent urine for culture and gram stain.

## 2014-07-14 NOTE — Progress Notes (Signed)
  [name], MD Phone: [phone]  Subjective:   Derek Murray is a 53 y.o. year old very pleasant male patient who presents with the following:  Fever/Incomplete voiding Symptoms started suddenly yesterday morning with incomplete voiding. Normal stream when pees.  Blood in urine yesterday morning.  Fever up to 102/nausea, dizziness.  Does not feel ill overall.  Has not tried any therapy at this point.  ROS- no fever/chills. No back pain. No dysuria, hesitancy, urgency, or penile discharge.   Past Medical History- Met with urology about 5 years ago due to history hypospadias and recurrent UTI, prostatitis 2 years ago, HLD, GERD Sexual risk factors- not active in over 12 years.   Medications- reviewed and updated Current Outpatient Prescriptions  Medication Sig Dispense Refill  . simvastatin (ZOCOR) 10 MG tablet TAKE 1 TABLET (10 MG TOTAL) BY MOUTH AT BEDTIME.  90 tablet  0  . ciprofloxacin (CIPRO) 500 MG tablet Take 1 tablet (500 mg total) by mouth 2 (two) times daily.  20 tablet  0   No current facility-administered medications for this visit.    Objective: BP 122/64  Pulse 88  Temp(Src) 101.1 F (38.4 C)  Wt 166 lb (75.297 kg) Gen: NAD, resting comfortably in chair, not acutely ill appearing CV: RRR no murmurs rubs or gallops Lungs: CTAB no crackles, wheeze, rhonchi Abdomen: soft/nontender/nondistended/normal bowel sounds. No rebound or guarding.  GU: penis without discharge Rectal: edematous prostate but nontender Ext: no edema Skin: warm, dry, no rash   Assessment/Plan:  Prostatitis Bacterial prostatitis (edematous prostate, obstructive symptoms, febrile) without risk factors for STDs. Doubt urethritis. No dysuria to suggest epididymitis. Possible UTI but think prostatitis more likely.  Patient with similar issue 2 years ago. Discussed case with PCP Dr. Shawna Orleans and decision made to treat patient x 10 days with ciprofloxacin and check in either by phone or in  office within a week, if persistent symptoms, would treat x 3 weeks. Discussed with patient I typically treat for 6 weeks but he would prefer to treat as he did in 2013. We discussed potential risks of incomplete eradication of bacteria. In addition, patient declined urology referral at this time for recurrent UTI in patient with hypospadias. Sent urine for culture and gram stain.    Meds ordered this encounter  Medications  . ciprofloxacin (CIPRO) 500 MG tablet    Sig: Take 1 tablet (500 mg total) by mouth 2 (two) times daily.    Dispense:  20 tablet    Refill:  0

## 2014-07-14 NOTE — Patient Instructions (Addendum)
Take antibiotics for 10 days. See me or call me or message me in this time frame towards the end and if you have ANY continued symptoms, will complete minimum of 3 week course.   We will send your urine for culture and gram stain.   Reasons to seek care sooner: worsening fever or feeling sick overall, continued fevers into next week

## 2014-07-15 LAB — GRAM STAIN: Gram Stain: NONE SEEN

## 2014-07-17 LAB — URINE CULTURE: Colony Count: 90000

## 2014-07-24 ENCOUNTER — Ambulatory Visit (INDEPENDENT_AMBULATORY_CARE_PROVIDER_SITE_OTHER): Payer: Federal, State, Local not specified - PPO | Admitting: Family Medicine

## 2014-07-24 ENCOUNTER — Encounter: Payer: Self-pay | Admitting: Family Medicine

## 2014-07-24 VITALS — BP 112/82 | HR 60 | Temp 98.6°F | Wt 164.0 lb

## 2014-07-24 DIAGNOSIS — N3000 Acute cystitis without hematuria: Secondary | ICD-10-CM

## 2014-07-24 DIAGNOSIS — N410 Acute prostatitis: Secondary | ICD-10-CM

## 2014-07-24 NOTE — Patient Instructions (Signed)
Urinary tract infection with ? Of prostatitis   Since symptoms are completely resolved, we made a joint decision to not continue full 6 weeks of treatment with understanding that you will come see [place] immediately if symptoms recur and also understanding that if this was prostatitis (which you did not have the exact classic presentation) that 10 days of antibiotics would be inadequate to fully eradicate the bacteria.   I would recommend if you want PSA testing to wait at least a month before scheduling your physical.

## 2014-07-24 NOTE — Assessment & Plan Note (Signed)
Complete resolution of symptoms with 8 days of ciprofloxacin. Due to complete resolution, made joint decision to discontinue continued treatment (I leaned toward 3-6 weeks treatment given acute fevers, obstructive symptoms edematous prostate but patient preferred to minimize antibiotic course and given patient had a nontender prostate previously, agreed this was reasonable). If symptoms were to recur, would get repeat urine culture and treat 3-6 weeks. We also discussed PSA screening which i advised of grade D recommendation from USPTF. His preference would be to proceed forward with this at physical so I advised him to hold off for now in case prostatitis was playing a role in symptoms.

## 2014-07-24 NOTE — Progress Notes (Signed)
  [name], MD Phone: [phone]  Subjective:   Derek Murray is a 53 y.o. year old very pleasant male patient who presents with the following:  UTI/? Prostatitis Patient seen 10 days ago and thought to have bacterial prostatitis due to urinary obstructive symptoms, acute onset fevers, and edematous prostate. Patient started on ciprofloxacin. Culture grew klebsiella senstive to this. Fevers resolved in 1-2 days after abx and then developed burning with urination 1-2 days later lasting 5 days this resolved 2 days ago. Finished last dose of medication this morning  ROS- no feer/chills. Incomplete emptying feeling has resolved. No hematuria. No dysuria at this point  Past Medical History- Met with urology about 5 years ago due to history hypospadias and recurrent UTI, prostatitis 2 years ago with 3 week treatment, HLD, GERD Sexual risk factors- not active in over 12 years.    Medications- reviewed and updated Current Outpatient Prescriptions  Medication Sig Dispense Refill  . simvastatin (ZOCOR) 10 MG tablet TAKE 1 TABLET (10 MG TOTAL) BY MOUTH AT BEDTIME.  90 tablet  0   No current facility-administered medications for this visit.    Objective: BP 112/82  Pulse 60  Temp(Src) 98.6 F (37 C)  Wt 164 lb (74.39 kg) Gen: NAD, resting comfortably, afebrile, not warm to touch Abdomen: soft/nontender/nondistended/normal bowel sounds. No rebound or guarding.  MSK: No CVA tenderness  Assessment/Plan:  Prostatitis vs. UTI Complete resolution of symptoms with 8 days of ciprofloxacin. Due to complete resolution, made joint decision to discontinue continued treatment (I leaned toward 3-6 weeks treatment given acute fevers, obstructive symptoms edematous prostate but patient preferred to minimize antibiotic course and given patient had a nontender prostate previously, agreed this was reasonable). If symptoms were to recur, would get repeat urine culture and treat 3-6 weeks. We also  discussed PSA screening which i advised of grade D recommendation from USPTF. His preference would be to proceed forward with this at physical so I advised him to hold off for now in case prostatitis was playing a role in symptoms.

## 2014-09-08 ENCOUNTER — Other Ambulatory Visit (INDEPENDENT_AMBULATORY_CARE_PROVIDER_SITE_OTHER): Payer: Federal, State, Local not specified - PPO

## 2014-09-08 DIAGNOSIS — Z0000 Encounter for general adult medical examination without abnormal findings: Secondary | ICD-10-CM

## 2014-09-08 LAB — CBC WITH DIFFERENTIAL/PLATELET
Basophils Absolute: 0 10*3/uL (ref 0.0–0.1)
Basophils Relative: 0.9 % (ref 0.0–3.0)
EOS PCT: 11.2 % — AB (ref 0.0–5.0)
Eosinophils Absolute: 0.4 10*3/uL (ref 0.0–0.7)
HCT: 47.2 % (ref 39.0–52.0)
Hemoglobin: 15.8 g/dL (ref 13.0–17.0)
LYMPHS ABS: 1.4 10*3/uL (ref 0.7–4.0)
Lymphocytes Relative: 36.9 % (ref 12.0–46.0)
MCHC: 33.4 g/dL (ref 30.0–36.0)
MCV: 90.4 fl (ref 78.0–100.0)
Monocytes Absolute: 0.4 10*3/uL (ref 0.1–1.0)
Monocytes Relative: 9.5 % (ref 3.0–12.0)
Neutro Abs: 1.5 10*3/uL (ref 1.4–7.7)
Neutrophils Relative %: 41.5 % — ABNORMAL LOW (ref 43.0–77.0)
PLATELETS: 178 10*3/uL (ref 150.0–400.0)
RBC: 5.23 Mil/uL (ref 4.22–5.81)
RDW: 12.2 % (ref 11.5–15.5)
WBC: 3.7 10*3/uL — AB (ref 4.0–10.5)

## 2014-09-08 LAB — POCT URINALYSIS DIPSTICK
Bilirubin, UA: NEGATIVE
GLUCOSE UA: NEGATIVE
Ketones, UA: NEGATIVE
LEUKOCYTES UA: NEGATIVE
NITRITE UA: NEGATIVE
RBC UA: NEGATIVE
Spec Grav, UA: 1.015
UROBILINOGEN UA: 0.2
pH, UA: 5.5

## 2014-09-08 LAB — HEPATIC FUNCTION PANEL
ALT: 25 U/L (ref 0–53)
AST: 23 U/L (ref 0–37)
Albumin: 4.4 g/dL (ref 3.5–5.2)
Alkaline Phosphatase: 77 U/L (ref 39–117)
BILIRUBIN DIRECT: 0.2 mg/dL (ref 0.0–0.3)
BILIRUBIN TOTAL: 1.5 mg/dL — AB (ref 0.2–1.2)
Total Protein: 7.6 g/dL (ref 6.0–8.3)

## 2014-09-08 LAB — BASIC METABOLIC PANEL
BUN: 14 mg/dL (ref 6–23)
CALCIUM: 9.5 mg/dL (ref 8.4–10.5)
CHLORIDE: 107 meq/L (ref 96–112)
CO2: 26 meq/L (ref 19–32)
CREATININE: 1 mg/dL (ref 0.4–1.5)
GFR: 105.32 mL/min (ref >60.00)
GLUCOSE: 94 mg/dL (ref 70–99)
Potassium: 4 mEq/L (ref 3.5–5.1)
Sodium: 141 mEq/L (ref 135–145)

## 2014-09-08 LAB — LIPID PANEL
Cholesterol: 258 mg/dL — ABNORMAL HIGH (ref 0–200)
HDL: 39.8 mg/dL (ref >39.00)
LDL Cholesterol: 188 mg/dL — ABNORMAL HIGH (ref 0–99)
NONHDL: 218.2
TRIGLYCERIDES: 153 mg/dL — AB (ref 0.0–149.0)
Total CHOL/HDL Ratio: 6
VLDL: 30.6 mg/dL (ref 0.0–40.0)

## 2014-09-08 LAB — PSA: PSA: 1.04 ng/mL (ref 0.10–4.00)

## 2014-09-08 LAB — TSH: TSH: 0.82 u[IU]/mL (ref 0.35–4.50)

## 2014-09-16 ENCOUNTER — Encounter: Payer: Federal, State, Local not specified - PPO | Admitting: Internal Medicine

## 2014-10-30 ENCOUNTER — Ambulatory Visit (INDEPENDENT_AMBULATORY_CARE_PROVIDER_SITE_OTHER): Payer: Federal, State, Local not specified - PPO | Admitting: Internal Medicine

## 2014-10-30 ENCOUNTER — Encounter: Payer: Self-pay | Admitting: Internal Medicine

## 2014-10-30 VITALS — BP 108/64 | Temp 99.3°F | Ht 66.0 in | Wt 173.0 lb

## 2014-10-30 DIAGNOSIS — N410 Acute prostatitis: Secondary | ICD-10-CM

## 2014-10-30 DIAGNOSIS — R300 Dysuria: Secondary | ICD-10-CM

## 2014-10-30 LAB — POCT URINALYSIS DIPSTICK
BILIRUBIN UA: NEGATIVE
GLUCOSE UA: NEGATIVE
Ketones, UA: NEGATIVE
NITRITE UA: NEGATIVE
Spec Grav, UA: <=1.005
Urobilinogen, UA: 0.2
pH, UA: 6

## 2014-10-30 MED ORDER — CIPROFLOXACIN HCL 500 MG PO TABS: 500.0000 mg | ORAL_TABLET | Freq: Two times a day (BID) | ORAL | Status: DC

## 2014-10-30 NOTE — Progress Notes (Signed)
   Subjective:    Patient ID: Derek Murray, male    DOB: January 11, 1961, 53 y.o.   MRN: [id number]  HPI  53 year old African-American male with history of hyperlipidemia and corrected hypospadias presents with symptoms of fever and chills x 24 hrs. His symptoms started last night. Patient has had several bouts of prostatitis over the years. Most recently he was seen by Dr. Yong Channel in August 2015 and required 3 weeks of antibiotics to treat acute prostatitis.  Patient denies any symptoms of BPH.  Review of Systems Fever and chills, no dizziness    Past Medical History  Diagnosis Date  . Hyperlipidemia   . Hypospadias     congenital hypospadia-surgically corrected  . 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 Umbilical hernia     asymptomatic    History   Social History  . Marital Status: Single    Spouse Name: N/A    Number of Children: N/A  . Years of Education: N/A   Occupational History  . Not on file.   Social History Main Topics  . Smoking status: Former Research scientist (life sciences)  . Smokeless tobacco: Never Used  . Alcohol Use: No  . Drug Use: No  . Sexual Activity: Not on file   Other Topics Concern  . 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 Hypospadias correction    . Eye surgery    . Colonoscopy      Family History  Problem Relation Age of Onset  . Diabetes    . Hypertension    . Prostate cancer    . Stroke    . Colon cancer Paternal Grandfather   . Esophageal cancer Neg Hx   . Stomach cancer Neg Hx   . Rectal cancer Neg Hx     No Known Allergies  Current Outpatient Prescriptions on File Prior to Visit  Medication Sig Dispense Refill  . simvastatin (ZOCOR) 10 MG tablet TAKE 1 TABLET  (10 MG TOTAL) BY MOUTH AT BEDTIME. 90 tablet 0   No current facility-administered medications on file prior to visit.    BP 108/64 mmHg  Temp(Src) 99.3 F (37.4 C) (Oral)  Ht 5\' 6"  (1.676 m)  Wt 173 lb (78.472 kg)  BMI 27.94 kg/m2    Objective:   Physical Exam  Constitutional: He appears well-developed and well-nourished.  HENT:  Head: Normocephalic and atraumatic.  Mouth/Throat: Oropharynx is clear and moist.  Cardiovascular: Normal rate, regular rhythm and normal heart sounds.   Pulmonary/Chest: Effort normal and breath sounds normal.  Abdominal: Soft. Bowel sounds are normal. There is no rebound and no guarding.  Suprapubic tenderness  Skin: Skin is warm and dry.  Psychiatric: He has a normal mood and affect. His behavior is normal.          Assessment & Plan:

## 2014-10-30 NOTE — Assessment & Plan Note (Addendum)
53 year old American male with recurrence of prostatitis. His urinalysis was abnormal. Treat with ciprofloxacin 500 mg twice daily for 3 weeks.  Send urine culture. If patient continues to have recurrences of prostatitis, we discussed referral to urology.  Previous urine culture grew KLEBSIELLA OXYTOCA sensitive to ciprofloxacin.

## 2014-10-30 NOTE — Progress Notes (Signed)
Pre visit review using our clinic review tool, if applicable. No additional management support is needed unless otherwise documented below in the visit note. 

## 2014-11-02 LAB — URINE CULTURE

## 2014-11-06 ENCOUNTER — Encounter: Payer: Federal, State, Local not specified - PPO | Admitting: Internal Medicine

## 2014-11-28 ENCOUNTER — Ambulatory Visit (INDEPENDENT_AMBULATORY_CARE_PROVIDER_SITE_OTHER): Payer: Federal, State, Local not specified - PPO | Admitting: Internal Medicine

## 2014-11-28 ENCOUNTER — Encounter: Payer: Self-pay | Admitting: Internal Medicine

## 2014-11-28 VITALS — BP 120/70 | HR 71 | Temp 98.1°F | Ht 66.5 in | Wt 171.0 lb

## 2014-11-28 DIAGNOSIS — N410 Acute prostatitis: Secondary | ICD-10-CM

## 2014-11-28 DIAGNOSIS — E785 Hyperlipidemia, unspecified: Secondary | ICD-10-CM

## 2014-11-28 DIAGNOSIS — Z0000 Encounter for general adult medical examination without abnormal findings: Secondary | ICD-10-CM

## 2014-11-28 DIAGNOSIS — K429 Umbilical hernia without obstruction or gangrene: Secondary | ICD-10-CM

## 2014-11-28 LAB — LIPID PANEL
CHOL/HDL RATIO: 4
Cholesterol: 177 mg/dL (ref 0–200)
HDL: 40.1 mg/dL (ref >39.00)
LDL Cholesterol: 121 mg/dL — ABNORMAL HIGH (ref 0–99)
NONHDL: 136.9
Triglycerides: 79 mg/dL (ref 0.0–149.0)
VLDL: 15.8 mg/dL (ref 0.0–40.0)

## 2014-11-28 LAB — HIV ANTIBODY (ROUTINE TESTING W REFLEX): HIV: NONREACTIVE

## 2014-11-28 NOTE — Progress Notes (Signed)
Pre visit review using our clinic review tool, if applicable. No additional management support is needed unless otherwise documented below in the visit note. 

## 2014-11-28 NOTE — Progress Notes (Signed)
Subjective:    Patient ID: Derek Murray, male    DOB: May 31, 1961, 53 y.o.   MRN: [id number]  HPI  53 year old [place] American male with history of hyperlipidemia and prostatitis for routine physical. Patient reports his prostate symptoms resolved with completing 3 week course of ciprofloxacin.  Hyperlipidemia-his last lipid panel was completed in September 2015. At the time, patient was not taking his simvastatin on a regular basis.  Social history and family history reviewed and updated.  Patient also complains of history of IBS-constipation predominant. His symptoms improved with taking over-the-counter probiotics. He is not taking psyllium fiber laxative regularly.  Review of Systems  Constitutional: Negative for activity change, appetite change and unexpected weight change.  Eyes: Negative for visual disturbance.  Respiratory: Negative for cough, chest tightness and shortness of breath.   Cardiovascular: Negative for chest pain.  Genitourinary: Negative for difficulty urinating.  Neurological: Negative for headaches.  Gastrointestinal: Negative for abdominal pain, heartburn melena or hematochezia.  Recurrent umbilical hernia is getting larger.  Chronic intermittent constipation Psych: Negative for depression or anxiety Endo:  No polyuria or polydypsia        Past Medical History  Diagnosis Date  . Hyperlipidemia   . Hypospadias     congenital hypospadia-surgically corrected  . 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 Umbilical hernia     asymptomatic    History   Social History  . Marital Status: Single    Spouse Name: N/A    Number of Children: N/A  . Years of Education: N/A   Occupational History  . Not on file.   Social History Main Topics  . Smoking status: Former Research scientist (life sciences)  . Smokeless tobacco: Never Used  . Alcohol Use: No  . Drug Use: No  . Sexual Activity: Not  on file   Other Topics Concern  . 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 Hypospadias correction    . Eye surgery    . Colonoscopy      Family History  Problem Relation Age of Onset  . Diabetes    . Hypertension    . Prostate cancer    . Stroke    . Colon cancer Paternal Grandfather   . Esophageal cancer Neg Hx   . Stomach cancer Neg Hx   . Rectal cancer Neg Hx     No Known Allergies  Current Outpatient Prescriptions on File Prior to Visit  Medication Sig Dispense Refill  . simvastatin (ZOCOR) 10 MG tablet TAKE 1 TABLET (10 MG TOTAL) BY MOUTH AT BEDTIME. 90 tablet 0   No current facility-administered medications on file prior to visit.    BP 120/70 mmHg  Pulse 71  Temp(Src) 98.1 F (36.7 C) (Oral)  Ht 5' 6.5" (1.689 m)  Wt 171 lb (77.565 kg)  BMI 27.19 kg/m2    Objective:   Physical Exam  Constitutional: He is oriented to person, place, and time. He appears well-developed and well-nourished. No distress.  HENT:  Head: Normocephalic and atraumatic.  Right Ear: External ear normal.  Left Ear: External ear normal.  Mouth/Throat: Oropharynx is clear and moist.  Eyes: Conjunctivae and EOM are normal. Pupils are equal, round, and reactive to light. No scleral icterus.  Neck: Neck supple.  Cardiovascular: Normal rate, regular rhythm and normal heart sounds.   No murmur heard. Pulmonary/Chest: Effort normal and breath sounds normal. He has no wheezes.  Abdominal: Soft. Bowel sounds are normal. He exhibits no distension.  Umbilical hernia  Musculoskeletal: He exhibits no edema.  Lymphadenopathy:    He has no cervical adenopathy.  Neurological: He is alert and oriented to person, place, and time. No cranial nerve deficit.  Skin: Skin is warm and dry.  Psychiatric: He  has a normal mood and affect. His behavior is normal.          Assessment & Plan:

## 2014-11-28 NOTE — Assessment & Plan Note (Signed)
Patient's umbilical hernia is getting larger. It is asymptomatic. Patient requests referral to general surgeon for elective repair.

## 2014-11-28 NOTE — Assessment & Plan Note (Signed)
Medication compliance encouraged. Monitor FLP and LFTs.

## 2014-11-28 NOTE — Assessment & Plan Note (Signed)
Resolved with 3 week course of ciprofloxacin 500 mg twice daily

## 2014-11-28 NOTE — Assessment & Plan Note (Addendum)
Reviewed adult health maintenance protocols.  Patient's PSA is stable. His recent is a rectal exam was for nodules or asymmetry.  Weight loss encouraged. Regular exercise encouraged.  Obtain screening HIV antibody.  Patient declines flu vaccine.  He is uptodate with colonoscopy and tetanus vaccine.

## 2014-12-01 ENCOUNTER — Telehealth: Payer: Self-pay | Admitting: Internal Medicine

## 2014-12-01 NOTE — Telephone Encounter (Signed)
Derek Murray from [facility]  call to say the pt will have surgery on 12/18/14

## 2014-12-02 NOTE — Telephone Encounter (Signed)
FYI

## 2014-12-18 ENCOUNTER — Ambulatory Visit (INDEPENDENT_AMBULATORY_CARE_PROVIDER_SITE_OTHER): Payer: Self-pay | Admitting: Surgery

## 2014-12-18 NOTE — H&P (Signed)
  History of Present Illness Derek Murray. Camiyah Friberg MD; 12/18/2014 11:25 AM) Patient words: umb hernia.  The patient is a 53 year old male who presents with an umbilical hernia. Referred by Dr. Drema Pry for evaluation of umbilical hernia This is a 53 year old male who presents with a 5 year history of a slowly enlarging umbilical hernia. This remains nontender although he does occasionally get some discomfort with her some pressure against this area. He does report some constipation but no nausea or vomiting. The hernia has become noticeably larger over the last year and he is now referred for surgical evaluation.   Allergies Briant Cedar, CMA; 12/18/2014 10:52 AM) No Known Drug Allergies12/31/2015  Medication History Briant Cedar, CMA; 12/18/2014 10:53 AM) Zocor (10MG  Tablet, Oral) Active.  Vitals Briant Cedar CMA; 12/18/2014 10:54 AM) 12/18/2014 10:53 AM Weight: 174.25 lb Height: 67in Body Surface Area: 1.93 m Body Mass Index: 27.29 kg/m Temp.: 98.67F  Pulse: 59 (Regular)  BP: 124/80 (Sitting, Left Arm, Standard)    Physical Exam Rodman Key K. Arman Loy MD; 12/18/2014 11:25 AM) The physical exam findings are as follows: Note:WDWN in NAD HEENT: EOMI, sclera anicteric Neck: No masses, no thyromegaly Lungs: CTA bilaterally; normal respiratory effort CV: Regular rate and rhythm; no murmurs Abd: +bowel sounds, soft, non-tender, protruding umbilical hernia - partiallyl reducible; no sign of infection or inflammation Ext: Well-perfused; no edema Skin: Warm, dry; no sign of jaundice    Assessment & Plan Rodman Key K. Kalleigh Harbor MD; 01/60/1093 23:55 AM) UMBILICAL HERNIA WITHOUT OBSTRUCTION AND WITHOUT GANGRENE (553.1  K42.9) Current Plans  Schedule for Surgery - Umbilical hernia repair with mesh. The surgical procedure has been discussed with the patient. Potential risks, benefits, alternative treatments, and expected outcomes have been explained. All of the patient's  questions at this time have been answered. The likelihood of reaching the patient's treatment goal is good. The patient understand the proposed surgical procedure and wishes to proceed.  Derek Murray. Georgette Dover, MD, [facility]  General/ Trauma Surgery  12/18/2014 11:26 AM

## 2015-02-25 ENCOUNTER — Other Ambulatory Visit: Payer: Self-pay | Admitting: Internal Medicine

## 2015-03-06 ENCOUNTER — Encounter (HOSPITAL_BASED_OUTPATIENT_CLINIC_OR_DEPARTMENT_OTHER): Payer: Self-pay | Admitting: *Deleted

## 2015-03-06 NOTE — Progress Notes (Signed)
No labs needed

## 2015-03-12 ENCOUNTER — Encounter (HOSPITAL_BASED_OUTPATIENT_CLINIC_OR_DEPARTMENT_OTHER)
Admission: RE | Disposition: A | Discharge status: Home / Self Care | Payer: Self-pay | Source: Ambulatory Visit | Attending: Surgery

## 2015-03-12 ENCOUNTER — Encounter (HOSPITAL_BASED_OUTPATIENT_CLINIC_OR_DEPARTMENT_OTHER): Payer: Self-pay | Admitting: *Deleted

## 2015-03-12 ENCOUNTER — Ambulatory Visit (HOSPITAL_BASED_OUTPATIENT_CLINIC_OR_DEPARTMENT_OTHER): Payer: Federal, State, Local not specified - PPO | Admitting: Anesthesiology

## 2015-03-12 ENCOUNTER — Ambulatory Visit (HOSPITAL_BASED_OUTPATIENT_CLINIC_OR_DEPARTMENT_OTHER)
Admission: RE | Admit: 2015-03-12 | Discharge: 2015-03-12 | Disposition: A | Discharge status: Home / Self Care | Payer: Federal, State, Local not specified - PPO | Source: Ambulatory Visit | Attending: Surgery | Admitting: Surgery

## 2015-03-12 DIAGNOSIS — K429 Umbilical hernia without obstruction or gangrene: Secondary | ICD-10-CM | POA: Diagnosis not present

## 2015-03-12 HISTORY — DX: Presence of spectacles and contact lenses: Z97.3

## 2015-03-12 HISTORY — PX: INSERTION OF MESH: SHX5868

## 2015-03-12 HISTORY — PX: UMBILICAL HERNIA REPAIR: SHX196

## 2015-03-12 LAB — POCT HEMOGLOBIN-HEMACUE: HEMOGLOBIN: 16.6 g/dL (ref 13.0–17.0)

## 2015-03-12 SURGERY — REPAIR, HERNIA, UMBILICAL, ADULT
Anesthesia: General

## 2015-03-12 MED ORDER — MIDAZOLAM HCL 2 MG/2ML IJ SOLN
1.0000 mg | INTRAMUSCULAR | Status: DC | PRN
Start: 2015-03-12 — End: 2015-03-12

## 2015-03-12 MED ORDER — ONDANSETRON HCL 4 MG/2ML IJ SOLN
INTRAMUSCULAR | Status: DC | PRN
  Administered 2015-03-12: 4 mg via INTRAVENOUS

## 2015-03-12 MED ORDER — OXYCODONE HCL 5 MG/5ML PO SOLN: 5.0000 mg | Freq: Once | ORAL | Status: AC | PRN

## 2015-03-12 MED ORDER — OXYCODONE HCL 5 MG PO TABS
ORAL_TABLET | ORAL | Status: AC
  Filled 2015-03-12: qty 1

## 2015-03-12 MED ORDER — MEPERIDINE HCL 25 MG/ML IJ SOLN
6.2500 mg | INTRAMUSCULAR | Status: DC | PRN
Start: 2015-03-12 — End: 2015-03-12

## 2015-03-12 MED ORDER — CEFAZOLIN SODIUM-DEXTROSE 2-3 GM-% IV SOLR
INTRAVENOUS | Status: AC
Start: 2015-03-12 — End: 2015-03-12
  Filled 2015-03-12: qty 50

## 2015-03-12 MED ORDER — MIDAZOLAM HCL 2 MG/2ML IJ SOLN
INTRAMUSCULAR | Status: AC
  Filled 2015-03-12: qty 2

## 2015-03-12 MED ORDER — OXYCODONE-ACETAMINOPHEN 5-325 MG PO TABS
1.0000 | ORAL_TABLET | ORAL | Status: DC | PRN
Start: 2015-03-12 — End: 2015-03-12

## 2015-03-12 MED ORDER — LIDOCAINE HCL (CARDIAC) 20 MG/ML IV SOLN
INTRAVENOUS | Status: DC | PRN
  Administered 2015-03-12: 100 mg via INTRAVENOUS

## 2015-03-12 MED ORDER — MIDAZOLAM HCL 5 MG/5ML IJ SOLN
INTRAMUSCULAR | Status: DC | PRN
  Administered 2015-03-12: 2 mg via INTRAVENOUS

## 2015-03-12 MED ORDER — FENTANYL CITRATE 0.05 MG/ML IJ SOLN: 50.0000 ug | INTRAMUSCULAR | Status: DC | PRN

## 2015-03-12 MED ORDER — DEXAMETHASONE SODIUM PHOSPHATE 4 MG/ML IJ SOLN
INTRAMUSCULAR | Status: DC | PRN
  Administered 2015-03-12: 10 mg via INTRAVENOUS

## 2015-03-12 MED ORDER — ONDANSETRON HCL 4 MG/2ML IJ SOLN: 4.0000 mg | Freq: Once | INTRAMUSCULAR | Status: DC | PRN

## 2015-03-12 MED ORDER — ONDANSETRON HCL 4 MG/2ML IJ SOLN: 4.0000 mg | INTRAMUSCULAR | Status: DC | PRN

## 2015-03-12 MED ORDER — BUPIVACAINE-EPINEPHRINE 0.25% -1:200000 IJ SOLN
INTRAMUSCULAR | Status: DC | PRN
  Administered 2015-03-12: 10 mL

## 2015-03-12 MED ORDER — HYDROMORPHONE HCL 1 MG/ML IJ SOLN: 0.2500 mg | INTRAMUSCULAR | Status: DC | PRN

## 2015-03-12 MED ORDER — FENTANYL CITRATE 0.05 MG/ML IJ SOLN
INTRAMUSCULAR | Status: AC
  Filled 2015-03-12: qty 4

## 2015-03-12 MED ORDER — PROPOFOL 10 MG/ML IV EMUL
INTRAVENOUS | Status: AC
  Filled 2015-03-12: qty 100

## 2015-03-12 MED ORDER — FENTANYL CITRATE 0.05 MG/ML IJ SOLN
INTRAMUSCULAR | Status: DC | PRN
  Administered 2015-03-12: 25 ug via INTRAVENOUS
  Administered 2015-03-12: 100 ug via INTRAVENOUS
  Administered 2015-03-12: 25 ug via INTRAVENOUS

## 2015-03-12 MED ORDER — PROPOFOL 10 MG/ML IV BOLUS
INTRAVENOUS | Status: DC | PRN
  Administered 2015-03-12: 150 mg via INTRAVENOUS

## 2015-03-12 MED ORDER — BUPIVACAINE-EPINEPHRINE (PF) 0.25% -1:200000 IJ SOLN
INTRAMUSCULAR | Status: AC
  Filled 2015-03-12: qty 30

## 2015-03-12 MED ORDER — MORPHINE SULFATE 2 MG/ML IJ SOLN: 2.0000 mg | INTRAMUSCULAR | Status: DC | PRN

## 2015-03-12 MED ORDER — LACTATED RINGERS IV SOLN
INTRAVENOUS | Status: DC
  Administered 2015-03-12: 07:00:00 via INTRAVENOUS

## 2015-03-12 MED ORDER — OXYCODONE-ACETAMINOPHEN 5-325 MG PO TABS: 1.0000 | ORAL_TABLET | ORAL | Status: DC | PRN

## 2015-03-12 MED ORDER — CEFAZOLIN SODIUM-DEXTROSE 2-3 GM-% IV SOLR
2.0000 g | INTRAVENOUS | Status: AC
  Administered 2015-03-12: 2 g via INTRAVENOUS

## 2015-03-12 MED ORDER — CHLORHEXIDINE GLUCONATE 4 % EX LIQD: 1.0000 "application " | Freq: Once | CUTANEOUS | Status: DC

## 2015-03-12 MED ORDER — OXYCODONE HCL 5 MG PO TABS
5.0000 mg | ORAL_TABLET | Freq: Once | ORAL | Status: AC | PRN
  Administered 2015-03-12: 5 mg via ORAL

## 2015-03-12 SURGICAL SUPPLY — 57 items
APL SKNCLS STERI-STRIP NONHPOA (GAUZE/BANDAGES/DRESSINGS) ×1
APPLICATOR COTTON TIP 6IN STRL (MISCELLANEOUS) ×2 IMPLANT
BENZOIN TINCTURE PRP APPL 2/3 (GAUZE/BANDAGES/DRESSINGS) ×2 IMPLANT
BLADE CLIPPER SURG (BLADE) ×2 IMPLANT
BLADE HEX COATED 2.75 (ELECTRODE) ×2 IMPLANT
BLADE SURG 15 STRL LF DISP TIS (BLADE) ×1 IMPLANT
BLADE SURG 15 STRL SS (BLADE) ×2
CANISTER SUCT 1200ML W/VALVE (MISCELLANEOUS) IMPLANT
CHLORAPREP W/TINT 26ML (MISCELLANEOUS) ×2 IMPLANT
COVER BACK TABLE 60X90IN (DRAPES) ×2 IMPLANT
COVER MAYO STAND STRL (DRAPES) ×2 IMPLANT
DECANTER SPIKE VIAL GLASS SM (MISCELLANEOUS) IMPLANT
DRAPE LAPAROTOMY T 102X78X121 (DRAPES) ×2 IMPLANT
DRAPE UTILITY XL STRL (DRAPES) ×2 IMPLANT
DRSG TEGADERM 4X4.75 (GAUZE/BANDAGES/DRESSINGS) ×2 IMPLANT
ELECT REM PT RETURN 9FT ADLT (ELECTROSURGICAL) ×2
ELECTRODE REM PT RTRN 9FT ADLT (ELECTROSURGICAL) ×1 IMPLANT
GLOVE BIO SURGEON STRL SZ7 (GLOVE) ×2 IMPLANT
GLOVE BIO SURGEON STRL SZ7.5 (GLOVE) ×1 IMPLANT
GLOVE BIOGEL PI IND STRL 7.5 (GLOVE) ×2 IMPLANT
GLOVE BIOGEL PI INDICATOR 7.5 (GLOVE) ×2
GLOVE SURG SS PI 7.0 STRL IVOR (GLOVE) ×2 IMPLANT
GOWN STRL REUS W/ TWL LRG LVL3 (GOWN DISPOSABLE) ×2 IMPLANT
GOWN STRL REUS W/TWL LRG LVL3 (GOWN DISPOSABLE) ×6
MESH VENTRALEX ST 1-7/10 CRC S (Mesh General) ×2 IMPLANT
NDL HYPO 25X1 1.5 SAFETY (NEEDLE) ×1 IMPLANT
NDL SAFETY ECLIPSE 18X1.5 (NEEDLE) IMPLANT
NEEDLE HYPO 18GX1.5 SHARP (NEEDLE)
NEEDLE HYPO 25X1 1.5 SAFETY (NEEDLE) ×2 IMPLANT
NS IRRIG 1000ML POUR BTL (IV SOLUTION) IMPLANT
PACK BASIN DAY SURGERY FS (CUSTOM PROCEDURE TRAY) ×2 IMPLANT
PENCIL BUTTON HOLSTER BLD 10FT (ELECTRODE) ×2 IMPLANT
SLEEVE SCD COMPRESS KNEE MED (MISCELLANEOUS) ×2 IMPLANT
SPONGE GAUZE 2X2 8PLY STRL LF (GAUZE/BANDAGES/DRESSINGS) ×2 IMPLANT
SPONGE GAUZE 4X4 12PLY STER LF (GAUZE/BANDAGES/DRESSINGS) IMPLANT
STAPLER VISISTAT 35W (STAPLE) IMPLANT
STRIP CLOSURE SKIN 1/2X4 (GAUZE/BANDAGES/DRESSINGS) ×2 IMPLANT
SUT MNCRL AB 4-0 PS2 18 (SUTURE) ×2 IMPLANT
SUT NOVA 0 T19/GS 22DT (SUTURE) ×4 IMPLANT
SUT NOVA NAB DX-16 0-1 5-0 T12 (SUTURE) ×2 IMPLANT
SUT PDS AB 0 CT 36 (SUTURE) IMPLANT
SUT PROLENE 0 CT 1 CR/8 (SUTURE) IMPLANT
SUT SILK 3 0 TIES 17X18 (SUTURE)
SUT SILK 3-0 18XBRD TIE BLK (SUTURE) IMPLANT
SUT VIC AB 2-0 CT1 27 (SUTURE)
SUT VIC AB 2-0 CT1 TAPERPNT 27 (SUTURE) IMPLANT
SUT VIC AB 3-0 SH 27 (SUTURE) ×2
SUT VIC AB 3-0 SH 27X BRD (SUTURE) ×1 IMPLANT
SUT VIC AB 4-0 BRD 54 (SUTURE) IMPLANT
SUT VIC AB 4-0 SH 18 (SUTURE) IMPLANT
SUT VICRYL 4-0 PS2 18IN ABS (SUTURE) IMPLANT
SYR BULB 3OZ (MISCELLANEOUS) IMPLANT
SYR CONTROL 10ML LL (SYRINGE) ×2 IMPLANT
TOWEL OR 17X24 6PK STRL BLUE (TOWEL DISPOSABLE) ×2 IMPLANT
TOWEL OR NON WOVEN STRL DISP B (DISPOSABLE) ×2 IMPLANT
TUBE CONNECTING 20X1/4 (TUBING) IMPLANT
YANKAUER SUCT BULB TIP NO VENT (SUCTIONS) IMPLANT

## 2015-03-12 NOTE — Anesthesia Postprocedure Evaluation (Signed)
Anesthesia Post Note  Patient: Derek Murray  Procedure(s) Performed: Procedure(s) (LRB): UMBILICAL HERNIA REPAIR WITH MESH (N/A) INSERTION OF MESH (N/A)  Anesthesia type: general  Patient location: PACU  Post pain: Pain level controlled  Post assessment: Patient's Cardiovascular Status Stable  Last Vitals:  Filed Vitals:   03/12/15 0830  BP: 113/73  Pulse: 64  Temp:   Resp: 12    Post vital signs: Reviewed and stable  Level of consciousness: sedated  Complications: No apparent anesthesia complications

## 2015-03-12 NOTE — Transfer of Care (Signed)
Immediate Anesthesia Transfer of Care Note  Patient: Derek Murray  Procedure(s) Performed: Procedure(s): UMBILICAL HERNIA REPAIR WITH MESH (N/A) INSERTION OF MESH (N/A)  Patient Location: PACU  Anesthesia Type:General  Level of Consciousness: sedated  Airway & Oxygen Therapy: Patient Spontanous Breathing and Patient connected to face mask oxygen  Post-op Assessment: Report given to RN and Post -op Vital signs reviewed and stable  Post vital signs: Reviewed and stable  Last Vitals:  Filed Vitals:   03/12/15 0819  BP:   Pulse: 69  Temp:   Resp: 27    Complications: No apparent anesthesia complications

## 2015-03-12 NOTE — H&P (Signed)
  History of Present Illness  Patient words: umb hernia.  The patient is a 54 year old male who presents with an umbilical hernia. Referred by Dr. Drema Pry for evaluation of umbilical hernia This is a 54 year old male who presents with a 5 year history of a slowly enlarging umbilical hernia. This remains nontender although he does occasionally get some discomfort with her some pressure against this area. He does report some constipation but no nausea or vomiting. The hernia has become noticeably larger over the last year and he is now referred for surgical evaluation.   Allergies  No Known Drug Allergies12/31/2015  Medication History Zocor (10MG  Tablet, Oral) Active.  Vitals  12/18/2014 10:53 AM Weight: 174.25 lb Height: 67in Body Surface Area: 1.93 m Body Mass Index: 27.29 kg/m Temp.: 98.90F  Pulse: 59 (Regular)  BP: 124/80 (Sitting, Left Arm, Standard)    Physical Exam  The physical exam findings are as follows: Note:WDWN in NAD HEENT: EOMI, sclera anicteric Neck: No masses, no thyromegaly Lungs: CTA bilaterally; normal respiratory effort CV: Regular rate and rhythm; no murmurs Abd: +bowel sounds, soft, non-tender, protruding umbilical hernia - partiallyl reducible; no sign of infection or inflammation Ext: Well-perfused; no edema Skin: Warm, dry; no sign of jaundice    Assessment & Plan  UMBILICAL HERNIA WITHOUT OBSTRUCTION AND WITHOUT GANGRENE (553.1  K42.9) Current Plans  Schedule for Surgery - Umbilical hernia repair with mesh. The surgical procedure has been discussed with the patient. Potential risks, benefits, alternative treatments, and expected outcomes have been explained. All of the patient's questions at this time have been answered. The likelihood of reaching the patient's treatment goal is good. The patient understand the proposed surgical procedure and wishes to proceed.  Imogene Burn. Georgette Dover, MD, [facility]  General/  Trauma Surgery  03/12/2015 7:21 AM

## 2015-03-12 NOTE — Anesthesia Preprocedure Evaluation (Addendum)
Anesthesia Evaluation  Patient identified by MRN, date of birth, ID band Patient awake    Reviewed: Allergy & Precautions, NPO status , Patient's Chart, lab work & pertinent test results  History of Anesthesia Complications Negative for: history of anesthetic complications  Airway Mallampati: I  TM Distance: >3 FB Neck ROM: Full    Dental   Pulmonary former smoker,          Cardiovascular negative cardio ROS      Neuro/Psych negative neurological ROS  negative psych ROS   GI/Hepatic GERD-  Medicated and Controlled,  Endo/Other    Renal/GU      Musculoskeletal   Abdominal   Peds  Hematology   Anesthesia Other Findings   Reproductive/Obstetrics                           Anesthesia Physical Anesthesia Plan  ASA: II  Anesthesia Plan: General   Post-op Pain Management:    Induction: Intravenous  Airway Management Planned: LMA  Additional Equipment:   Intra-op Plan:   Post-operative Plan: Extubation in OR  Informed Consent: I have reviewed the patients History and Physical, chart, labs and discussed the procedure including the risks, benefits and alternatives for the proposed anesthesia with the patient or authorized representative who has indicated his/her understanding and acceptance.     Plan Discussed with: CRNA and Surgeon  Anesthesia Plan Comments:        Anesthesia Quick Evaluation

## 2015-03-12 NOTE — Op Note (Signed)
Indications:  The patient presented with a history of an enlarging, reducible umbilical hernia.  The patient was examined and we recommended umbilical hernia repair with mesh.  Pre-operative diagnosis:  Umbilical hernia  Post-operative diagnosis:  Same  Surgeon: Jabria Loos K.   Assistants: none  Anesthesia: General LMA anesthesia  ASA Class: 2   Procedure Details  The patient was seen again in the Holding Room. The risks, benefits, complications, treatment options, and expected outcomes were discussed with the patient. The possibilities of reaction to medication, pulmonary aspiration, perforation of viscus, bleeding, recurrent infection, the need for additional procedures, and development of a complication requiring transfusion or further operation were discussed with the patient and/or family. There was concurrence with the proposed plan, and informed consent was obtained. The site of surgery was properly noted/marked. The patient was taken to the Operating Room, identified as Derek Murray, and the procedure verified as umbilical hernia repair. A Time Out was held and the above information confirmed.  After an adequate level of general anesthesia was obtained, the patient's abdomen was prepped with Chloraprep and draped in sterile fashion.  We made a transverse incision above the umbilicus.  Dissection was carried down to the hernia sac with cautery.  We dissected bluntly around the hernia sac down to the edge of the fascial defect.  We reduced the hernia sac back into the pre-peritoneal space.  The fascial defect measured 1.5 cm.  We cleared the fascia in all directions.  A small Ventralex mesh was inserted into the pre-peritoneal space and was deployed.  The mesh was secured with four trans-fascial sutures of 0 Novofil.  The fascial defect was closed with multiple interrupted figure-of-eight 1 Novofil sutures.  The base of the umbilicus was tacked down with 3-0 Vicryl.  3-0 Vicryl was  used to close the subcutaneous tissues and 4-0 Monocryl was used to close the skin.  Steri-strips and clean dressing were applied.  The patient was extubated and brought to the recovery room in stable condition.  All sponge, instrument, and needle counts were correct prior to closure and at the conclusion of the case.   Estimated Blood Loss: Minimal          Complications: None; patient tolerated the procedure well.         Disposition: PACU - hemodynamically stable.         Condition: stable   Imogene Burn. Georgette Dover, MD, [facility]  General/ Trauma Surgery  03/12/2015 8:13 AM

## 2015-03-12 NOTE — Anesthesia Procedure Notes (Signed)
Procedure Name: LMA Insertion Date/Time: 03/12/2015 7:37 AM Performed by: Maryella Shivers Pre-anesthesia Checklist: Patient identified, Emergency Drugs available, Suction available and Patient being monitored Patient Re-evaluated:Patient Re-evaluated prior to inductionOxygen Delivery Method: Circle System Utilized Preoxygenation: Pre-oxygenation with 100% oxygen Intubation Type: IV induction Ventilation: Mask ventilation without difficulty LMA: LMA inserted LMA Size: 5.0 Number of attempts: 1 Airway Equipment and Method: bite block Placement Confirmation: positive ETCO2 Tube secured with: Tape Dental Injury: Teeth and Oropharynx as per pre-operative assessment

## 2015-03-12 NOTE — Discharge Instructions (Signed)
[facility] ° °UMBILICAL HERNIA REPAIR: POST OP INSTRUCTIONS ° °Always review your discharge instruction sheet given to you by the facility where your surgery was performed. °IF YOU HAVE DISABILITY OR FAMILY LEAVE FORMS, YOU MUST BRING THEM TO THE OFFICE FOR PROCESSING.   °DO NOT GIVE THEM TO YOUR DOCTOR. ° °1. A  prescription for pain medication may be given to you upon discharge.  Take your pain medication as prescribed, if needed.  If narcotic pain medicine is not needed, then you may take acetaminophen (Tylenol) or ibuprofen (Advil) as needed. °2. Take your usually prescribed medications unless otherwise directed. °3. If you need a refill on your pain medication, please contact your pharmacy.  They will contact our office to request authorization. Prescriptions will not be filled after 5 pm or on week-ends. °4. You should follow a light diet the first 24 hours after arrival home, such as soup and crackers, etc.  Be sure to include lots of fluids daily.  Resume your normal diet the day after surgery. °5. Most patients will experience some swelling and bruising around the umbilicus or in the groin and scrotum.  Ice packs and reclining will help.  Swelling and bruising can take several days to resolve.  °6. It is common to experience some constipation if taking pain medication after surgery.  Increasing fluid intake and taking a stool softener (such as Colace) will usually help or prevent this problem from occurring.  A mild laxative (Milk of Magnesia or Miralax) should be taken according to package directions if there are no bowel movements after 48 hours. °7. Unless discharge instructions indicate otherwise, you may remove your bandages 24-48 hours after surgery, and you may shower at that time.  You will have steri-strips (small skin tapes) in place directly over the incision.  These strips should be left on the skin for 7-10 days. °8. ACTIVITIES:  You may resume regular (light) daily activities  beginning the next day--such as daily self-care, walking, climbing stairs--gradually increasing activities as tolerated.  You may have sexual intercourse when it is comfortable.  Refrain from any heavy lifting or straining until approved by your doctor. °a. You may drive when you are no longer taking prescription pain medication, you can comfortably wear a seatbelt, and you can safely maneuver your car and apply brakes. °b. RETURN TO WORK:  2-3 weeks with light duty - no lifting over 15 lbs. °9. You should see your doctor in the office for a follow-up appointment approximately 2-3 weeks after your surgery.  Make sure that you call for this appointment within a day or two after you arrive home to insure a convenient appointment time. °10. OTHER INSTRUCTIONS:  __________________________________________________________________________________________________________________________________________________________________________________________  °WHEN TO CALL YOUR DOCTOR: °1. Fever over 101.0 °2. Inability to urinate °3. Nausea and/or vomiting °4. Extreme swelling or bruising °5. Continued bleeding from incision. °6. Increased pain, redness, or drainage from the incision ° °The clinic staff is available to answer your questions during regular business hours.  Please don’t hesitate to call and ask to speak to one of the nurses for clinical concerns.  If you have a medical emergency, go to the nearest emergency room or call 911.  A surgeon from [facility] is always on call at the hospital ° ° °[address], [address], [place], [place]  [address] ? ° [address] [address], [place], [place]   [address] °[phone]    [phone]    FAX [phone] °Web site: [email or web address] ° ° °  Post Anesthesia Home Care Instructions ° °Activity: °Get plenty of rest for the remainder of the day. A responsible adult should stay with you for 24 hours following the procedure.  °For the next 24 hours, DO  NOT: °-Drive a car °-Operate machinery °-Drink alcoholic beverages °-Take any medication unless instructed by your physician °-Make any legal decisions or sign important papers. ° °Meals: °Start with liquid foods such as gelatin or soup. Progress to regular foods as tolerated. Avoid greasy, spicy, heavy foods. If nausea and/or vomiting occur, drink only clear liquids until the nausea and/or vomiting subsides. Call your physician if vomiting continues. ° °Special Instructions/Symptoms: °Your throat may feel dry or sore from the anesthesia or the breathing tube placed in your throat during surgery. If this causes discomfort, gargle with warm salt water. The discomfort should disappear within 24 hours. ° °

## 2015-03-17 ENCOUNTER — Encounter (HOSPITAL_BASED_OUTPATIENT_CLINIC_OR_DEPARTMENT_OTHER): Payer: Self-pay | Admitting: Surgery

## 2015-10-01 ENCOUNTER — Other Ambulatory Visit: Payer: Self-pay | Admitting: Internal Medicine

## 2016-04-08 DIAGNOSIS — K080 Exfoliation of teeth due to systemic causes: Secondary | ICD-10-CM | POA: Diagnosis not present

## 2016-05-25 ENCOUNTER — Telehealth: Payer: Self-pay | Admitting: Internal Medicine

## 2016-05-25 NOTE — Telephone Encounter (Signed)
Pt would like to transfer from dr Shawna Orleans and start off with a cpx.. Can I use 30 min slot this year?

## 2016-05-26 NOTE — Telephone Encounter (Signed)
Yes thanks 

## 2016-05-27 NOTE — Telephone Encounter (Signed)
Pt has been sch

## 2016-05-27 NOTE — Telephone Encounter (Signed)
lmom for pt to call back

## 2016-06-10 ENCOUNTER — Encounter: Payer: Self-pay | Admitting: Family Medicine

## 2016-09-01 ENCOUNTER — Other Ambulatory Visit (INDEPENDENT_AMBULATORY_CARE_PROVIDER_SITE_OTHER): Payer: Federal, State, Local not specified - PPO

## 2016-09-01 DIAGNOSIS — Z0000 Encounter for general adult medical examination without abnormal findings: Secondary | ICD-10-CM | POA: Diagnosis not present

## 2016-09-01 LAB — BASIC METABOLIC PANEL
BUN: 11 mg/dL (ref 6–23)
CO2: 30 mEq/L (ref 19–32)
Calcium: 9.7 mg/dL (ref 8.4–10.5)
Chloride: 102 mEq/L (ref 96–112)
Creatinine, Ser: 1.02 mg/dL (ref 0.40–1.50)
GFR: 97.48 mL/min (ref >60.00)
GLUCOSE: 80 mg/dL (ref 70–99)
POTASSIUM: 4.1 meq/L (ref 3.5–5.1)
Sodium: 140 mEq/L (ref 135–145)

## 2016-09-01 LAB — LIPID PANEL
CHOL/HDL RATIO: 6
CHOLESTEROL: 240 mg/dL — AB (ref 0–200)
HDL: 38.6 mg/dL — ABNORMAL LOW (ref >39.00)
LDL CALC: 173 mg/dL — AB (ref 0–99)
NonHDL: 201.08
Triglycerides: 141 mg/dL (ref 0.0–149.0)
VLDL: 28.2 mg/dL (ref 0.0–40.0)

## 2016-09-01 LAB — CBC WITH DIFFERENTIAL/PLATELET
BASOS ABS: 0.1 10*3/uL (ref 0.0–0.1)
Basophils Relative: 1.7 % (ref 0.0–3.0)
EOS PCT: 16.9 % — AB (ref 0.0–5.0)
Eosinophils Absolute: 0.8 10*3/uL — ABNORMAL HIGH (ref 0.0–0.7)
HCT: 48.4 % (ref 39.0–52.0)
HEMOGLOBIN: 16.4 g/dL (ref 13.0–17.0)
LYMPHS PCT: 33.3 % (ref 12.0–46.0)
Lymphs Abs: 1.7 10*3/uL (ref 0.7–4.0)
MCHC: 34 g/dL (ref 30.0–36.0)
MCV: 89.6 fl (ref 78.0–100.0)
Monocytes Absolute: 0.5 10*3/uL (ref 0.1–1.0)
Monocytes Relative: 10.7 % (ref 3.0–12.0)
Neutro Abs: 1.9 10*3/uL (ref 1.4–7.7)
Neutrophils Relative %: 37.4 % — ABNORMAL LOW (ref 43.0–77.0)
Platelets: 173 10*3/uL (ref 150.0–400.0)
RBC: 5.4 Mil/uL (ref 4.22–5.81)
RDW: 12.2 % (ref 11.5–15.5)
WBC: 5 10*3/uL (ref 4.0–10.5)

## 2016-09-01 LAB — HEPATIC FUNCTION PANEL
ALT: 20 U/L (ref 0–53)
AST: 17 U/L (ref 0–37)
Albumin: 4.7 g/dL (ref 3.5–5.2)
Alkaline Phosphatase: 75 U/L (ref 39–117)
BILIRUBIN TOTAL: 2 mg/dL — AB (ref 0.2–1.2)
Bilirubin, Direct: 0.3 mg/dL (ref 0.0–0.3)
TOTAL PROTEIN: 7.3 g/dL (ref 6.0–8.3)

## 2016-09-01 LAB — POC URINALSYSI DIPSTICK (AUTOMATED)
Blood, UA: NEGATIVE
Glucose, UA: NEGATIVE
KETONES UA: NEGATIVE
LEUKOCYTES UA: NEGATIVE
Nitrite, UA: NEGATIVE
PH UA: 5.5
SPEC GRAV UA: 1.02
UROBILINOGEN UA: 1

## 2016-09-01 LAB — PSA: PSA: 1.29 ng/mL (ref 0.10–4.00)

## 2016-09-01 LAB — TSH: TSH: 1.01 u[IU]/mL (ref 0.35–4.50)

## 2016-09-08 ENCOUNTER — Encounter: Payer: Federal, State, Local not specified - PPO | Admitting: Family Medicine

## 2016-09-29 ENCOUNTER — Encounter: Payer: Self-pay | Admitting: Family Medicine

## 2016-09-29 ENCOUNTER — Ambulatory Visit (INDEPENDENT_AMBULATORY_CARE_PROVIDER_SITE_OTHER): Payer: Federal, State, Local not specified - PPO | Admitting: Family Medicine

## 2016-09-29 VITALS — BP 124/78 | HR 63 | Temp 98.2°F | Ht 66.5 in | Wt 179.2 lb

## 2016-09-29 DIAGNOSIS — Z0001 Encounter for general adult medical examination with abnormal findings: Secondary | ICD-10-CM

## 2016-09-29 DIAGNOSIS — E785 Hyperlipidemia, unspecified: Secondary | ICD-10-CM

## 2016-09-29 DIAGNOSIS — H9313 Tinnitus, bilateral: Secondary | ICD-10-CM

## 2016-09-29 DIAGNOSIS — H9319 Tinnitus, unspecified ear: Secondary | ICD-10-CM | POA: Insufficient documentation

## 2016-09-29 MED ORDER — SIMVASTATIN 10 MG PO TABS: ORAL_TABLET | ORAL | 3 refills | Status: DC

## 2016-09-29 NOTE — Patient Instructions (Addendum)
Refilled simvastatin 10mg . Will not be enough on its own to control cholesterol. Advise get the exercise up to 150 minutes a week. Consider DASH diet or mediterannean diet which are both considered heart healthy  We will call you within a week about your referral to ENT for tinnitus. If you do not hear within 2 weeks, give [place] a call.   Wt Readings from Last 3 Encounters:  09/29/16 179 lb 3.2 oz (81.3 kg)  03/12/15 175 lb (79.4 kg)  11/28/14 171 lb (77.6 kg)  Would try to get back to around 165-170 that you were previously within next year

## 2016-09-29 NOTE — Progress Notes (Signed)
Phone: [phone]  Subjective:  Patient presents today for their annual physical. Chief complaint-noted.   See problem oriented charting- ROS- full  review of systems was completed and negative including No chest pain or shortness of breath. No headache or blurry vision.   The following were reviewed and entered/updated in epic: Past Medical History:  Diagnosis Date  . Hyperlipidemia   . Hypospadias    congenital hypospadia-surgically corrected  . 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 Umbilical hernia    s/p surgery  . Wears glasses    Patient Active Problem List   Diagnosis Date Noted  . Hyperbilirubinemia 11/03/2009    Priority: Medium  . Hyperlipidemia 09/27/2007    Priority: Medium  . Prostatitis 07/14/2014    Priority: Low  . History of adenomatous polyp of colon 11/03/2009    Priority: Low  . Umbilical hernia [id number]    Priority: Low  . Tinnitus 09/29/2016   Past Surgical History:  Procedure Laterality Date  . COLONOSCOPY    . EYE SURGERY     scar tissue   . HYPOSPADIAS CORRECTION    . INSERTION OF MESH N/A 03/12/2015   Procedure: INSERTION OF MESH;  Surgeon: Donnie Mesa, MD;  Location: [facility];  Service: General;  Laterality: N/A;  . UMBILICAL HERNIA REPAIR N/A 03/12/2015   Procedure: UMBILICAL HERNIA REPAIR WITH MESH;  Surgeon: Donnie Mesa, MD;  Location: [facility];  Service: General;  Laterality: N/A;    Family History  Problem Relation Age of Onset  . Diabetes    . Hypertension    . Prostate cancer    . Stroke Brother 58    nonsmoker. overweight  . Colon cancer Paternal Grandfather   . Hypertension Mother   . Diabetes Mother   . CAD Father     nonsmoker. 60 onset died 30  . Hypertension Father   . Diabetes Father   . CAD Brother     half brother  . Esophageal cancer Neg Hx   . Stomach cancer Neg Hx   . Rectal  cancer Neg Hx     Medications- reviewed and updated Current Outpatient Prescriptions  Medication Sig Dispense Refill  . simvastatin (ZOCOR) 10 MG tablet TAKE 1 TABLET (10 MG TOTAL) BY MOUTH AT BEDTIME. 30 tablet 0   No current facility-administered medications for this visit.     Allergies-reviewed and updated No Known Allergies  Social History   Social History  . Marital status: Single    Spouse name: N/A  . Number of children: N/A  . Years of education: N/A   Social History Main Topics  . Smoking status: Former Smoker    Packs/day: 0.10    Years: 10.00    Quit date: 12/19/1993  . Smokeless tobacco: Never Used     Comment: social smoking  . Alcohol use No  . Drug use: No  . Sexual activity: Not Asked   Other Topics Concern  . None   Social History Narrative   Single lives alone - in relationship. Not sexually active       Occupation: HR specialist for [employer]      Hobbies: tennis has faded, walking, tv, music    Objective: BP 124/78 (BP Location: Left Arm, Patient Position: Sitting, Cuff Size: Large)   Pulse 63   Temp 98.2 F (36.8 C) (Oral)   Ht 5' 6.5" (1.689 m)  Wt 179 lb 3.2 oz (81.3 kg)   SpO2 97%   BMI 28.49 kg/m  Gen: NAD, resting comfortably HEENT: Mucous membranes are moist. Oropharynx normal Neck: no thyromegaly CV: RRR no murmurs rubs or gallops Lungs: CTAB no crackles, wheeze, rhonchi Abdomen: soft/nontender/nondistended/normal bowel sounds. No rebound or guarding.  Ext: no edema Skin: warm, dry Neuro: grossly normal, moves all extremities, PERRLA Rectal: normal tone (nonboggy), normal sized prostate, no masses or tenderness  Assessment/Plan:  55 y.o. male presenting for annual physical.  Health Maintenance counseling: 1. Anticipatory guidance: Patient counseled regarding regular dental exams, eye exams, wearing seatbelts.  2. Risk factor reduction:  Advised patient of need for regular exercise and diet rich and fruits and  vegetables to reduce risk of heart attack and stroke. Discussed bumping exercise up- increasing plant based portion of diet 3. Immunizations/screenings/ancillary studies Immunization History  Administered Date(s) Administered  . Td 04/28/2009   Health Maintenance Due  Topic Date Due  . INFLUENZA VACCINE - declines 07/19/2016   4. Prostate cancer screening- low risk based off PSA trend and Rectal exam   Lab Results  Component Value Date   PSA 1.29 09/01/2016   PSA 1.04 09/08/2014   PSA 0.93 11/28/2011   5. Colon cancer screening - 04/2013 with 5 year follow up   Status of chronic or acute concerns   Hyperlipidemia Simvastatin 10mg , coenzyme q10 Off med at time of below labs Lab Results  Component Value Date   CHOL 240 (H) 09/01/2016   HDL 38.60 (L) 09/01/2016   LDLCALC 173 (H) 09/01/2016   LDLDIRECT 141.3 02/08/2013   TRIG 141.0 09/01/2016   CHOLHDL 6 09/01/2016  Restart 10mg  simvastatin, focus on healthy diet and exercise and recheck 1 year at CPE. Hopeful may be enough to get LDL <100 but likely will need 20mg  simvastatin  Tinnitus S: issues since getting out of military years ago but has worsened over last year.  A/P: likely hearing loss related- refer to ENT for further workup   Return in about 1 year (around 09/29/2017) for physical.  Orders Placed This Encounter  Procedures  . Ambulatory referral to ENT    Referral Priority:   Routine    Referral Type:   Consultation    Referral Reason:   Specialty Services Required    Requested Specialty:   Otolaryngology    Number of Visits Requested:   1    Meds ordered this encounter  Medications  . simvastatin (ZOCOR) 10 MG tablet    Sig: TAKE 1 TABLET (10 MG TOTAL) BY MOUTH AT BEDTIME.    Dispense:  92 tablet    Refill:  3   Return precautions advised.   [name], MD

## 2016-09-29 NOTE — Assessment & Plan Note (Addendum)
Simvastatin 10mg , coenzyme q10 Off med at time of below labs Lab Results  Component Value Date   CHOL 240 (H) 09/01/2016   HDL 38.60 (L) 09/01/2016   LDLCALC 173 (H) 09/01/2016   LDLDIRECT 141.3 02/08/2013   TRIG 141.0 09/01/2016   CHOLHDL 6 09/01/2016  Restart 10mg  simvastatin, focus on healthy diet and exercise and recheck 1 year at CPE. Hopeful may be enough to get LDL <100 but likely will need 20mg  simvastatin

## 2016-09-29 NOTE — Progress Notes (Signed)
Pre visit review using our clinic review tool, if applicable. No additional management support is needed unless otherwise documented below in the visit note. 

## 2016-09-29 NOTE — Assessment & Plan Note (Signed)
S: issues since getting out of military years ago but has worsened over last year.  A/P: likely hearing loss related- refer to ENT for further workup

## 2016-10-19 DIAGNOSIS — H9313 Tinnitus, bilateral: Secondary | ICD-10-CM | POA: Diagnosis not present

## 2016-11-08 DIAGNOSIS — K080 Exfoliation of teeth due to systemic causes: Secondary | ICD-10-CM | POA: Diagnosis not present

## 2016-11-09 ENCOUNTER — Other Ambulatory Visit: Payer: Self-pay | Admitting: Gastroenterology

## 2016-12-16 DIAGNOSIS — K080 Exfoliation of teeth due to systemic causes: Secondary | ICD-10-CM | POA: Diagnosis not present

## 2017-01-09 DIAGNOSIS — H9319 Tinnitus, unspecified ear: Secondary | ICD-10-CM | POA: Diagnosis not present

## 2017-01-09 DIAGNOSIS — E785 Hyperlipidemia, unspecified: Secondary | ICD-10-CM | POA: Diagnosis not present

## 2017-01-09 DIAGNOSIS — Z0001 Encounter for general adult medical examination with abnormal findings: Secondary | ICD-10-CM | POA: Diagnosis not present

## 2017-01-10 ENCOUNTER — Encounter (HOSPITAL_COMMUNITY): Payer: Self-pay

## 2017-01-10 ENCOUNTER — Ambulatory Visit (INDEPENDENT_AMBULATORY_CARE_PROVIDER_SITE_OTHER): Payer: Federal, State, Local not specified - PPO | Admitting: Family Medicine

## 2017-01-10 ENCOUNTER — Encounter: Payer: Self-pay | Admitting: Family Medicine

## 2017-01-10 ENCOUNTER — Ambulatory Visit (HOSPITAL_COMMUNITY): Admit: 2017-01-10 | Payer: Federal, State, Local not specified - PPO | Admitting: Gastroenterology

## 2017-01-10 VITALS — BP 118/66 | HR 92 | Temp 98.9°F | Ht 66.5 in | Wt 184.8 lb

## 2017-01-10 DIAGNOSIS — J069 Acute upper respiratory infection, unspecified: Secondary | ICD-10-CM | POA: Diagnosis not present

## 2017-01-10 SURGERY — COLONOSCOPY WITH PROPOFOL
Anesthesia: Monitor Anesthesia Care

## 2017-01-10 NOTE — Patient Instructions (Signed)
Upper Respiratory infection History and exam today are suggestive of viral infection most likely due to upper respiratory infection. Symptomatic treatment with: mucinex. I could also call in tessalon for you if needed but delsym is a good over the counter cough medicine.   We discussed that we did not find any infection that had higher probability of being bacterial such as pneumonia or strep throat or ear infection or sinusitis. We discussed signs that bacterial infection may have developed particularly fever or shortness of breath or worsening sinus pressure lasting more than days. Likely course of 1-2 weeks from start of cough. Patient is contagious and advised good handwashing and consideration of mask If going to be in public places.   Finally, we reviewed reasons to return to care including if symptoms worsen or persist or new concerns arise- once again particularly shortness of breath or fever.

## 2017-01-10 NOTE — Progress Notes (Signed)
PCP: [name], MD  Subjective:  Arben Karagiannis is a 56 y.o. year old very pleasant male patient who presents with Upper Respiratory infection symptoms including nasal congestion for 2 weeks now improving, mild sore throat, cough starting about 3 days ago largely  -started: as above, symptoms are worsening -previous treatments:  Has not tried OTC meds yet, just rest and hydration -sick contacts/travel/risks: denies flu exposure.   ROS-denies fever, SOB, NVD, tooth pain or sinus pain  Pertinent Past Medical History-  Patient Active Problem List   Diagnosis Date Noted  . Hyperbilirubinemia 11/03/2009    Priority: Medium  . Hyperlipidemia 09/27/2007    Priority: Medium  . Prostatitis 07/14/2014    Priority: Low  . History of adenomatous polyp of colon 11/03/2009    Priority: Low  . Umbilical hernia Q000111Q    Priority: Low  . Tinnitus 09/29/2016   Medications- reviewed  Current Outpatient Prescriptions  Medication Sig Dispense Refill  . simvastatin (ZOCOR) 10 MG tablet TAKE 1 TABLET (10 MG TOTAL) BY MOUTH AT BEDTIME. 92 tablet 3   No current facility-administered medications for this visit.     Objective: BP 118/66 (BP Location: Left Arm, Patient Position: Sitting, Cuff Size: Large)   Pulse 92   Temp 98.9 F (37.2 C) (Oral)   Ht 5' 6.5" (1.689 m)   Wt 184 lb 12.8 oz (83.8 kg)   SpO2 95%   BMI 29.38 kg/m  Gen: NAD, resting comfortably HEENT: Turbinates erythematous with clear drainage, TM normal, pharynx mildly erythematous with no tonsilar exudate or edema, no sinus tenderness CV: RRR no murmurs rubs or gallops Lungs: CTAB no crackles, wheeze, rhonchi  Ext: no edema Skin: warm, dry, no rash  Assessment/Plan:  Upper Respiratory infection History and exam today are suggestive of viral infection most likely due to upper respiratory infection. Symptomatic treatment with: mucinex. I could also call in tessalon for you if needed but delsym is a good over the  counter cough medicine.   We discussed that we did not find any infection that had higher probability of being bacterial such as pneumonia or strep throat or ear infection or sinusitis. We discussed signs that bacterial infection may have developed particularly fever or shortness of breath or worsening sinus pressure lasting more than days. Likely course of 1-2 weeks from start of cough. Patient is contagious and advised good handwashing and consideration of mask If going to be in public places.   Finally, we reviewed reasons to return to care including if symptoms worsen or persist or new concerns arise- once again particularly shortness of breath or fever.  [name], MD

## 2017-01-10 NOTE — Progress Notes (Signed)
Pre visit review using our clinic review tool, if applicable. No additional management support is needed unless otherwise documented below in the visit note. 

## 2017-01-25 ENCOUNTER — Ambulatory Visit (INDEPENDENT_AMBULATORY_CARE_PROVIDER_SITE_OTHER): Payer: Federal, State, Local not specified - PPO | Admitting: Family Medicine

## 2017-01-25 ENCOUNTER — Encounter: Payer: Self-pay | Admitting: Family Medicine

## 2017-01-25 VITALS — BP 118/82 | HR 83 | Temp 98.2°F | Ht 66.5 in | Wt 183.2 lb

## 2017-01-25 DIAGNOSIS — J189 Pneumonia, unspecified organism: Secondary | ICD-10-CM

## 2017-01-25 DIAGNOSIS — J181 Lobar pneumonia, unspecified organism: Secondary | ICD-10-CM

## 2017-01-25 MED ORDER — AZITHROMYCIN 250 MG PO TABS: ORAL_TABLET | ORAL | 0 refills | Status: DC

## 2017-01-25 NOTE — Patient Instructions (Signed)
Azithromycin for 5 days  If no better in a week, lets get a chest x-ray  If no pneumonia on x-ray- would give prednisone If pneumonia present- would start new antibiotic  Mucinex-DM may help with cough and help loosen some congestion  Community-Acquired Pneumonia, Adult Introduction Pneumonia is an infection of the lungs. One type of pneumonia can happen while a person is in a hospital. A different type can happen when a person is not in a hospital (community-acquired pneumonia). It is easy for this kind to spread from person to person. It can spread to you if you breathe near an infected person who coughs or sneezes. Some symptoms include:  A dry cough.  A wet (productive) cough.  Fever.  Sweating.  Chest pain. Follow these instructions at home:  Take over-the-counter and prescription medicines only as told by your doctor.  Only take cough medicine if you are losing sleep.  If you were prescribed an antibiotic medicine, take it as told by your doctor. Do not stop taking the antibiotic even if you start to feel better.  Sleep with your head and neck raised (elevated). You can do this by putting a few pillows under your head, or you can sleep in a recliner.  Do not use tobacco products. These include cigarettes, chewing tobacco, and e-cigarettes. If you need help quitting, ask your doctor.  Drink enough water to keep your pee (urine) clear or pale yellow. A shot (vaccine) can help prevent pneumonia. Shots are often suggested for:  People older than 56 years of age.  People older than 56 years of age:  Who are having cancer treatment.  Who have long-term (chronic) lung disease.  Who have problems with their body's defense system (immune system). You may also prevent pneumonia if you take these actions:  Get the flu (influenza) shot every year.  Go to the dentist as often as told.  Wash your hands often. If soap and water are not available, use hand  sanitizer. Contact a doctor if:  You have a fever.  You lose sleep because your cough medicine does not help. Get help right away if:  You are short of breath and it gets worse.  You have more chest pain.  Your sickness gets worse. This is very serious if:  You are an older adult.  Your body's defense system is weak.  You cough up blood. This information is not intended to replace advice given to you by your health care provider. Make sure you discuss any questions you have with your health care provider. Document Released: 05/23/2008 Document Revised: 05/12/2016 Document Reviewed: 04/01/2015  2017 Elsevier

## 2017-01-25 NOTE — Progress Notes (Signed)
Pre visit review using our clinic review tool, if applicable. No additional management support is needed unless otherwise documented below in the visit note. 

## 2017-01-25 NOTE — Progress Notes (Signed)
PCP: [name], MD  Subjective:  Derek Murray is a 56 y.o. year old very pleasant male patient who presents with symptoms including nasal congestion,  Cough, sputum that is  productive of yellow and green sputum.  - Fever: denies -wheeze: at times -started: over a month ago at this point- seen about 2 weeks ago and presumed on tail end of URI but then symptoms worsened after he left office -previous treatments: rest, hydration. Did not pick up delysm -sick contacts/travel/risks: denies flu exposure.   ROS-denies nausea, vomiting, sinus or dental pain.   Pertinent Past Medical History-  Patient Active Problem List   Diagnosis Date Noted  . Hyperbilirubinemia 11/03/2009    Priority: Medium  . Hyperlipidemia 09/27/2007    Priority: Medium  . Prostatitis 07/14/2014    Priority: Low  . History of adenomatous polyp of colon 11/03/2009    Priority: Low  . Umbilical hernia Q000111Q    Priority: Low  . Tinnitus 09/29/2016    Medications- reviewed  Current Outpatient Prescriptions  Medication Sig Dispense Refill  . simvastatin (ZOCOR) 10 MG tablet TAKE 1 TABLET (10 MG TOTAL) BY MOUTH AT BEDTIME. 92 tablet 3  . azithromycin (ZITHROMAX) 250 MG tablet Take 2 tabs on day 1, then 1 tab daily until finished 6 tablet 0   No current facility-administered medications for this visit.     Objective: BP 118/82 (BP Location: Left Arm, Patient Position: Sitting, Cuff Size: Large)   Pulse 83   Temp 98.2 F (36.8 C) (Oral)   Ht 5' 6.5" (1.689 m)   Wt 183 lb 3.2 oz (83.1 kg)   SpO2 97%   BMI 29.13 kg/m  Gen: NAD, resting comfortably HEENT: Turbinates erythematous, TM normal, pharynx mildly erythematous with no tonsilar exudate or edema, no sinus tenderness CV: RRR no murmurs rubs or gallops Lungs: Patient has RLL crackles. No wheeze, rhonchi Abdomen: soft/nontender/nondistended Ext: no edema Skin: warm, dry, no rash  Assessment/Plan:  Community acquired pneumonia Suspected  pneumonia given findings in RLL and cough for 4 weeks. We discussed option of x-ray vs. Antibiotics then x-ray if not approved- he opts for second choice. Given overall health and somewhat mild symptoms- primarily persistent cough and fatigue- will trial azithromycin which would give good atypical coverage.   We discussed if does not improve, get x-ray one week. If no pneumonia- prednisone trial (likely bronchitis at that point). If pneumonia- likely would add augmentin or similar on CAP algorithm  Other Symptomatic treatment with: mucinex DM (he wants to loosen secretions)  Finally, we reviewed reasons to return to care including if symptoms worsen or persist or new concerns arise- once again particularly shortness of breath or fever.  Meds ordered this encounter  Medications  . azithromycin (ZITHROMAX) 250 MG tablet    Sig: Take 2 tabs on day 1, then 1 tab daily until finished    Dispense:  6 tablet    Refill:  0    [name], MD

## 2017-01-31 ENCOUNTER — Telehealth: Payer: Self-pay | Admitting: Family Medicine

## 2017-01-31 DIAGNOSIS — R05 Cough: Secondary | ICD-10-CM

## 2017-01-31 DIAGNOSIS — R059 Cough, unspecified: Secondary | ICD-10-CM

## 2017-01-31 NOTE — Telephone Encounter (Signed)
Pt was seen on 01-25-17 for PNA . Pt is not getting better and would like to proceed with getting chest xray

## 2017-02-01 NOTE — Telephone Encounter (Signed)
I ordered x-ray. Please inform patient to go to following  Please go to [facility] - located [address] across the [address] from [place] - in the basement - Hours: 8:30-5:30 PM M-F. Do not need appointment.

## 2017-02-02 ENCOUNTER — Ambulatory Visit (INDEPENDENT_AMBULATORY_CARE_PROVIDER_SITE_OTHER)
Admission: RE | Admit: 2017-02-02 | Discharge: 2017-02-02 | Disposition: A | Discharge status: Home / Self Care | Payer: Federal, State, Local not specified - PPO | Source: Ambulatory Visit | Attending: Family Medicine | Admitting: Family Medicine

## 2017-02-02 ENCOUNTER — Other Ambulatory Visit: Payer: Self-pay | Admitting: Family Medicine

## 2017-02-02 DIAGNOSIS — R059 Cough, unspecified: Secondary | ICD-10-CM

## 2017-02-02 DIAGNOSIS — R05 Cough: Secondary | ICD-10-CM | POA: Diagnosis not present

## 2017-02-02 DIAGNOSIS — J189 Pneumonia, unspecified organism: Secondary | ICD-10-CM | POA: Diagnosis not present

## 2017-02-02 MED ORDER — PREDNISONE 20 MG PO TABS
ORAL_TABLET | ORAL | 0 refills | Status: DC
Start: 2017-02-02 — End: 2018-02-09

## 2017-02-02 NOTE — Telephone Encounter (Signed)
Spoke with pt and informed him of order for xray and to go to [facility].  Pt verbalized understanding.

## 2017-02-06 ENCOUNTER — Encounter: Payer: Self-pay | Admitting: Family Medicine

## 2017-02-20 ENCOUNTER — Encounter: Payer: Self-pay | Admitting: Family Medicine

## 2017-07-04 DIAGNOSIS — K080 Exfoliation of teeth due to systemic causes: Secondary | ICD-10-CM | POA: Diagnosis not present

## 2017-11-17 DIAGNOSIS — H40053 Ocular hypertension, bilateral: Secondary | ICD-10-CM | POA: Diagnosis not present

## 2017-12-22 ENCOUNTER — Other Ambulatory Visit: Payer: Self-pay | Admitting: Family Medicine

## 2017-12-22 ENCOUNTER — Telehealth: Payer: Self-pay | Admitting: Family Medicine

## 2017-12-22 MED ORDER — SIMVASTATIN 10 MG PO TABS: ORAL_TABLET | ORAL | 0 refills | Status: DC

## 2017-12-22 NOTE — Telephone Encounter (Signed)
Error

## 2017-12-22 NOTE — Telephone Encounter (Signed)
This encounter was created in error - please disregard.

## 2017-12-22 NOTE — Addendum Note (Signed)
Addended by: Corky Sox E on: 12/22/2017 03:25 PM   Modules accepted: Level of Service, SmartSet

## 2017-12-22 NOTE — Telephone Encounter (Signed)
Copied from [place]. Topic: Quick Communication - Rx Refill/Question >> Dec 22, 2017  1:10 PM Eulogio Bear J wrote: Has the patient contacted their pharmacy? Yes.  Pharmacy called    (Agent: If no, request that the patient contact the pharmacy for the refill.)   Preferred Pharmacy (with phone number or street name): [facility] cb# [phone] simvastatin (ZOCOR) 10 MG tablet   Agent: Please be advised that RX refills may take up to 3 business days. We ask that you follow-up with your pharmacy.

## 2017-12-22 NOTE — Telephone Encounter (Signed)
LR 09/29/16  OV:09/29/16  Pt requesting CPE. Appt made for 02/09/18 @ 0845. Pt reminded to be NPO after Midnight. Refilled simvastatin x 1 month

## 2017-12-22 NOTE — Telephone Encounter (Signed)
LR 09/29/16 OV 09/29/16  CPE made for 02/09/18 @ 0845  Simvastatin refilled x 1 month

## 2018-01-09 DIAGNOSIS — Z0001 Encounter for general adult medical examination with abnormal findings: Secondary | ICD-10-CM | POA: Diagnosis not present

## 2018-01-09 DIAGNOSIS — H9313 Tinnitus, bilateral: Secondary | ICD-10-CM | POA: Diagnosis not present

## 2018-01-09 DIAGNOSIS — E785 Hyperlipidemia, unspecified: Secondary | ICD-10-CM | POA: Diagnosis not present

## 2018-01-25 ENCOUNTER — Other Ambulatory Visit: Payer: Self-pay | Admitting: Family Medicine

## 2018-02-09 ENCOUNTER — Encounter: Payer: Self-pay | Admitting: Family Medicine

## 2018-02-09 ENCOUNTER — Ambulatory Visit (INDEPENDENT_AMBULATORY_CARE_PROVIDER_SITE_OTHER): Payer: Federal, State, Local not specified - PPO | Admitting: Family Medicine

## 2018-02-09 VITALS — BP 110/78 | HR 68 | Temp 98.2°F | Ht 66.5 in | Wt 182.4 lb

## 2018-02-09 DIAGNOSIS — Z0000 Encounter for general adult medical examination without abnormal findings: Secondary | ICD-10-CM | POA: Diagnosis not present

## 2018-02-09 DIAGNOSIS — E785 Hyperlipidemia, unspecified: Secondary | ICD-10-CM

## 2018-02-09 DIAGNOSIS — Z125 Encounter for screening for malignant neoplasm of prostate: Secondary | ICD-10-CM

## 2018-02-09 LAB — POC URINALSYSI DIPSTICK (AUTOMATED)
BILIRUBIN UA: NEGATIVE
GLUCOSE UA: NEGATIVE
Ketones, UA: NEGATIVE
LEUKOCYTES UA: NEGATIVE
NITRITE UA: NEGATIVE
Protein, UA: NEGATIVE
RBC UA: NEGATIVE
Spec Grav, UA: 1.025 (ref 1.010–1.025)
UROBILINOGEN UA: 0.2 U/dL
pH, UA: 6 (ref 5.0–8.0)

## 2018-02-09 LAB — LIPID PANEL
Cholesterol: 217 mg/dL — ABNORMAL HIGH (ref 0–200)
HDL: 44.5 mg/dL (ref >39.00)
LDL Cholesterol: 140 mg/dL — ABNORMAL HIGH (ref 0–99)
NONHDL: 172.65
TRIGLYCERIDES: 165 mg/dL — AB (ref 0.0–149.0)
Total CHOL/HDL Ratio: 5
VLDL: 33 mg/dL (ref 0.0–40.0)

## 2018-02-09 LAB — COMPREHENSIVE METABOLIC PANEL
ALK PHOS: 81 U/L (ref 39–117)
ALT: 31 U/L (ref 0–53)
AST: 25 U/L (ref 0–37)
Albumin: 4.8 g/dL (ref 3.5–5.2)
BUN: 14 mg/dL (ref 6–23)
CALCIUM: 10.1 mg/dL (ref 8.4–10.5)
CO2: 30 mEq/L (ref 19–32)
Chloride: 99 mEq/L (ref 96–112)
Creatinine, Ser: 1.07 mg/dL (ref 0.40–1.50)
GFR: 91.76 mL/min (ref >60.00)
GLUCOSE: 100 mg/dL — AB (ref 70–99)
POTASSIUM: 4.3 meq/L (ref 3.5–5.1)
Sodium: 138 mEq/L (ref 135–145)
TOTAL PROTEIN: 7.6 g/dL (ref 6.0–8.3)
Total Bilirubin: 1.9 mg/dL — ABNORMAL HIGH (ref 0.2–1.2)

## 2018-02-09 LAB — CBC
HEMATOCRIT: 50.6 % (ref 39.0–52.0)
HEMOGLOBIN: 17 g/dL (ref 13.0–17.0)
MCHC: 33.5 g/dL (ref 30.0–36.0)
MCV: 91.1 fl (ref 78.0–100.0)
PLATELETS: 187 10*3/uL (ref 150.0–400.0)
RBC: 5.56 Mil/uL (ref 4.22–5.81)
RDW: 12.4 % (ref 11.5–15.5)
WBC: 5.5 10*3/uL (ref 4.0–10.5)

## 2018-02-09 LAB — PSA: PSA: 1.37 ng/mL (ref 0.10–4.00)

## 2018-02-09 NOTE — Patient Instructions (Addendum)
Please stop by lab before you go  Over the next year I want you to trim at least 10 lbs off on our scales.   Call the ear, nose, throat back to follow up  Do rotator cuff exercises 2-3x a week for a month then at least once a week after that  Could try jock itch cream for the groin for 2 weeks. If area worsens or starts to itch- happy to see you back

## 2018-02-09 NOTE — Addendum Note (Signed)
Addended by: Kayren Eaves T on: 02/09/2018 09:36 AM   Modules accepted: Orders

## 2018-02-09 NOTE — Progress Notes (Signed)
Phone: [phone]  Subjective:  Patient presents today for their annual physical. Chief complaint-noted.   See problem oriented charting- ROS- full  review of systems was completed and negative except for: tinnitus, rash  The following were reviewed and entered/updated in epic: Past Medical History:  Diagnosis Date  . History of hepatitis B    1983. treated. cleared.   . Hyperlipidemia   . Hypospadias    congenital hypospadia-surgically corrected  . 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 Umbilical hernia    s/p surgery  . Wears glasses    Patient Active Problem List   Diagnosis Date Noted  . Hyperbilirubinemia 11/03/2009    Priority: Medium  . Hyperlipidemia 09/27/2007    Priority: Medium  . Prostatitis 07/14/2014    Priority: Low  . History of adenomatous polyp of colon 11/03/2009    Priority: Low  . Umbilical hernia 42/59/5638    Priority: Low  . Tinnitus 09/29/2016   Past Surgical History:  Procedure Laterality Date  . COLONOSCOPY    . EYE SURGERY     scar tissue   . HYPOSPADIAS CORRECTION    . INSERTION OF MESH N/A 03/12/2015   Procedure: INSERTION OF MESH;  Surgeon: Donnie Mesa, MD;  Location: [facility];  Service: General;  Laterality: N/A;  . UMBILICAL HERNIA REPAIR N/A 03/12/2015   Procedure: UMBILICAL HERNIA REPAIR WITH MESH;  Surgeon: Donnie Mesa, MD;  Location: [place];  Service: General;  Laterality: N/A;    Family History  Problem Relation Age of Onset  . Diabetes Unknown   . Hypertension Unknown   . Prostate cancer Unknown   . Stroke Brother 34       nonsmoker. overweight  . Colon cancer Paternal Grandfather   . Hypertension Mother   . Diabetes Mother   . CAD Father        nonsmoker. 78 onset died 30  . Hypertension Father   . Diabetes Father   . CAD Brother        half brother  . Esophageal cancer Neg Hx   .  Stomach cancer Neg Hx   . Rectal cancer Neg Hx     Medications- reviewed and updated Current Outpatient Medications  Medication Sig Dispense Refill  . simvastatin (ZOCOR) 10 MG tablet TAKE ONE TABLET BY MOUTH AT BEDTIME 90 tablet 1   No current facility-administered medications for this visit.     Allergies-reviewed and updated No Known Allergies  Social History   Social History Narrative   Single lives alone - in relationship. Not sexually active       Occupation: HR specialist for [employer]      Hobbies: tennis has faded, walking, tv, music    Objective: BP 110/78 (BP Location: Left Arm, Patient Position: Sitting, Cuff Size: Large)   Pulse 68   Temp 98.2 F (36.8 C) (Oral)   Ht 5' 6.5" (1.689 m)   Wt 182 lb 6.4 oz (82.7 kg)   SpO2 97%   BMI 29.00 kg/m  Gen: NAD, resting comfortably HEENT: Mucous membranes are moist. Oropharynx normal Neck: no thyromegaly CV: RRR no murmurs rubs or gallops Lungs: CTAB no crackles, wheeze, rhonchi Abdomen: soft/nontender/nondistended/normal bowel sounds. No rebound or guarding.  Ext: no edema Skin: warm, dry Neuro: grossly normal, moves all extremities, PERRLA Left shoulder normal except positive Hawkin's.  Rectal: normal tone, diffusely enlarged prostate, no masses or tenderness  Assessment/Plan:  57 y.o. male presenting for annual physical.  Health Maintenance counseling: 1. Anticipatory guidance: Patient counseled regarding regular dental exams -q6 months, eye exams -yearly, wearing seatbelts.  2. Risk factor reduction:  Advised patient of need for regular exercise and diet rich and fruits and vegetables to reduce risk of heart attack and stroke. Weight up 3 lbs from last CPE. Exercise- not good lately- encouraged 150 minutes a week- he thinks he will do better in warmer months. Diet-he thinks he needs to reduce sweets.  Wt Readings from Last 3 Encounters:  02/09/18 182 lb 6.4 oz (82.7 kg)  01/25/17 183 lb 3.2 oz (83.1  kg)  01/10/17 184 lb 12.8 oz (83.8 kg)  3. Immunizations/screenings/ancillary studies- discussed shingrix availability issues. Declines influenza vaccine.  Immunization History  Administered Date(s) Administered  . Td 04/28/2009  4. Prostate cancer screening-   Last visit PSA trended up slightly but still low risk trend- update psa and rectal exam today. bph on exam- nocturia once a night Lab Results  Component Value Date   PSA 1.29 09/01/2016   PSA 1.04 09/08/2014   PSA 0.93 11/28/2011   5. Colon cancer screening - 04/2013 with 5 year follow up so is due this year. Confirmed on recall list in epic with  6. Former smoker- quit 1995 and had 1 pack year (light smoker). AAA screen planned at 89. UA yearly. Doesn't qualify for lung cancer screening  Status of chronic or acute concerns   Hyperlipidemia- on simvastatin 10mg  and coq10- update lipids today. Discussed if elevated- working on [employer] loss/healthy eating instead of just increasing medicine- give a year trial probably.   Tinnitus for years since getting out of military- referred to ENT 09/2016. Told hearing loss related- prn follow up  Mild hyperbilirubinemia- usually in 1.5-2.5 range- update today  On azithromycin had some left shoulder pain- eventually resolved. Every once and a while bothers him mildly. Hard to do with weights.  - gave rotator cuff exercises to work on   Mild hyperpigmentation in groin for years with border- could be jock itch- advised topical trial. Not growing, not itching- present for years- not a high risk area  1 year cpe  Lab/Order associations: Preventative health care  Hyperlipidemia, unspecified hyperlipidemia type - Plan: CBC, Comprehensive metabolic panel, Lipid panel, POCT Urinalysis Dipstick (Automated)  Screening for prostate cancer - Plan: PSA  Return precautions advised.  [name], MD

## 2018-02-13 ENCOUNTER — Encounter: Payer: Self-pay | Admitting: Family Medicine

## 2018-07-28 ENCOUNTER — Other Ambulatory Visit: Payer: Self-pay | Admitting: Family Medicine

## 2018-11-05 ENCOUNTER — Encounter: Payer: Self-pay | Admitting: Physician Assistant

## 2018-11-05 ENCOUNTER — Ambulatory Visit: Payer: Federal, State, Local not specified - PPO | Admitting: Physician Assistant

## 2018-11-05 VITALS — BP 124/72 | HR 84 | Temp 98.4°F | Ht 66.5 in | Wt 186.2 lb

## 2018-11-05 DIAGNOSIS — J069 Acute upper respiratory infection, unspecified: Secondary | ICD-10-CM | POA: Diagnosis not present

## 2018-11-05 MED ORDER — BENZONATATE 100 MG PO CAPS: 100.0000 mg | ORAL_CAPSULE | Freq: Two times a day (BID) | ORAL | 0 refills | Status: DC | PRN

## 2018-11-05 MED ORDER — AZITHROMYCIN 250 MG PO TABS: ORAL_TABLET | ORAL | 0 refills | Status: DC

## 2018-11-05 NOTE — Patient Instructions (Signed)
It was great to see you!  Use medication as prescribed: Azithromycin antibiotic. Tessalon perles used for cough.  Push fluids and get plenty of rest. Please return if you are not improving as expected, or if you have high fevers (>101.5) or difficulty swallowing or worsening productive cough.  Call clinic with questions.  I hope you start feeling better soon!

## 2018-11-05 NOTE — Progress Notes (Signed)
Derek Murray is a 57 y.o. male here for a new problem.  I acted as a Education administrator for [employer], PA-C Anselmo Pickler, LPN  History of Present Illness:   Chief Complaint  Patient presents with  . Cough    Cough  This is a new problem. Episode onset: Starte a week ago. The problem has been gradually worsening. The problem occurs every few hours. The cough is productive of sputum [place] greenish yellow sputum). Associated symptoms include nasal congestion, postnasal drip and a sore throat (scratchy). Pertinent negatives include no chills, ear congestion, ear pain, fever, headaches or shortness of breath. Associated symptoms comments: Chest congestion started today.. The symptoms are aggravated by cold air and lying down. He has tried nothing for the symptoms. His past medical history is significant for bronchitis and pneumonia.   Having similar symptoms as to when he was feeling when he was diagnosed with walking PNA by his PCP. He has anxiety about this returning.  Denies CP or SOB.  Past Medical History:  Diagnosis Date  . History of hepatitis B    1983. treated. cleared.   . Hyperlipidemia   . Hypospadias    congenital hypospadia-surgically corrected  . 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 Umbilical hernia    s/p surgery  . Wears glasses      Social History   Socioeconomic History  . Marital status: Single    Spouse name: Not on file  . Number of children: Not on file  . Years of education: Not on file  . Highest education level: Not on file  Occupational History  . Not on file  Social Needs  . Financial resource strain: Not on file  . Food insecurity:    Worry: Not on file    Inability: Not on file  . Transportation needs:    Medical: Not on file    Non-medical: Not on file  Tobacco Use  . Smoking status: Former Smoker    Packs/day: 0.10    Years: 10.00    Pack years:  1.00    Last attempt to quit: 12/19/1993    Years since quitting: 24.8  . Smokeless tobacco: Never Used  . Tobacco comment: social smoking  Substance and Sexual Activity  . Alcohol use: No  . Drug use: No  . Sexual activity: Not on file  Lifestyle  . Physical activity:    Days per week: Not on file    Minutes per session: Not on file  . Stress: Not on file  Relationships  . Social connections:    Talks on phone: Not on file    Gets together: Not on file    Attends religious service: Not on file    Active member of club or organization: Not on file    Attends meetings of clubs or organizations: Not on file    Relationship status: Not on file  . Intimate partner violence:    Fear of current or ex partner: Not on file    Emotionally abused: Not on file    Physically abused: Not on file    Forced sexual activity: Not on file  Other Topics Concern  . Not on file  Social History Narrative   Single lives alone - in relationship. Not sexually active       Occupation: HR specialist for [employer]      Hobbies: tennis has faded, walking, tv, music  Past Surgical History:  Procedure Laterality Date  . COLONOSCOPY    . EYE SURGERY     scar tissue   . HYPOSPADIAS CORRECTION    . INSERTION OF MESH N/A 03/12/2015   Procedure: INSERTION OF MESH;  Surgeon: Donnie Mesa, MD;  Location: [facility];  Service: General;  Laterality: N/A;  . UMBILICAL HERNIA REPAIR N/A 03/12/2015   Procedure: UMBILICAL HERNIA REPAIR WITH MESH;  Surgeon: Donnie Mesa, MD;  Location: [facility];  Service: General;  Laterality: N/A;    Family History  Problem Relation Age of Onset  . Diabetes Unknown   . Hypertension Unknown   . Prostate cancer Unknown   . Stroke Brother 69       nonsmoker. overweight  . Colon cancer Paternal Grandfather   . Hypertension Mother   . Diabetes Mother   . CAD Father        nonsmoker. 52 onset died 56  . Hypertension Father   .  Diabetes Father   . CAD Brother        half brother  . Esophageal cancer Neg Hx   . Stomach cancer Neg Hx   . Rectal cancer Neg Hx     Allergies  Allergen Reactions  . Prednisone Other (See Comments)    "severe arm pain"    Current Medications:   Current Outpatient Medications:  .  simvastatin (ZOCOR) 10 MG tablet, TAKE ONE TABLET BY MOUTH AT BEDTIME, Disp: 90 tablet, Rfl: 1 .  azithromycin (ZITHROMAX) 250 MG tablet, Take two tablets on day 1, then one tablet daily x 4 days, Disp: 6 each, Rfl: 0 .  benzonatate (TESSALON) 100 MG capsule, Take 1 capsule (100 mg total) by mouth 2 (two) times daily as needed for cough., Disp: 20 capsule, Rfl: 0   Review of Systems:   Review of Systems  Constitutional: Negative for chills and fever.  HENT: Positive for postnasal drip and sore throat (scratchy). Negative for ear pain.   Respiratory: Positive for cough. Negative for shortness of breath.   Neurological: Negative for headaches.    Vitals:   Vitals:   11/05/18 1405  BP: 124/72  Pulse: 84  Temp: 98.4 F (36.9 C)  TempSrc: Oral  SpO2: 95%  Weight: 186 lb 3.2 oz (84.5 kg)  Height: 5' 6.5" (1.689 m)     Body mass index is 29.6 kg/m.  Physical Exam:   Physical Exam  Constitutional: He appears well-developed. He is cooperative.  Non-toxic appearance. He does not have a sickly appearance. He does not appear ill. No distress.  HENT:  Head: Normocephalic and atraumatic.  Right Ear: Tympanic membrane, external ear and ear canal normal. Tympanic membrane is not erythematous, not retracted and not bulging.  Left Ear: Tympanic membrane, external ear and ear canal normal. Tympanic membrane is not erythematous, not retracted and not bulging.  Nose: Mucosal edema and rhinorrhea present. Right sinus exhibits no maxillary sinus tenderness and no frontal sinus tenderness. Left sinus exhibits no maxillary sinus tenderness and no frontal sinus tenderness.  Mouth/Throat: Uvula is midline and  mucous membranes are normal. Posterior oropharyngeal erythema present. No posterior oropharyngeal edema. Tonsils are 1+ on the right. Tonsils are 1+ on the left.  Eyes: Conjunctivae and lids are normal.  Neck: Trachea normal.  Cardiovascular: Normal rate, regular rhythm, S1 normal, S2 normal, normal heart sounds and normal pulses.  No LE edema  Pulmonary/Chest: Effort normal and breath sounds normal. He has no decreased breath sounds.  He has no wheezes. He has no rhonchi. He has no rales.  Lymphadenopathy:    He has no cervical adenopathy.  Neurological: He is alert. GCS eye subscore is 4. GCS verbal subscore is 5. GCS motor subscore is 6.  Skin: Skin is warm, dry and intact.  Psychiatric: He has a normal mood and affect. His speech is normal and behavior is normal.  Nursing note and vitals reviewed.   Assessment and Plan:   Lisandro was seen today for cough.  Diagnoses and all orders for this visit:  Upper respiratory tract infection, unspecified type  Other orders -     azithromycin (ZITHROMAX) 250 MG tablet; Take two tablets on day 1, then one tablet daily x 4 days -     benzonatate (TESSALON) 100 MG capsule; Take 1 capsule (100 mg total) by mouth 2 (two) times daily as needed for cough.   No red flags on exam.  Will initiate Azithromycin per orders. Also discussed trialing Tessalon perles for cough as well as Mucinex. Discussed taking medications as prescribed. Reviewed return precautions including worsening fever, SOB, worsening cough or other concerns. Push fluids and rest. I recommend that patient follow-up if symptoms worsen or persist despite treatment x 7-10 days, sooner if needed.  . Reviewed expectations re: course of current medical issues. . Discussed self-management of symptoms. . Outlined signs and symptoms indicating need for more acute intervention. . Patient verbalized understanding and all questions were answered. . See orders for this visit as documented in the  electronic medical record. . Patient received an After-Visit Summary.  CMA or LPN served as scribe during this visit. History, Physical, and Plan performed by medical provider. The above documentation has been reviewed and is accurate and complete.  Inda Coke, PA-C

## 2018-11-06 ENCOUNTER — Ambulatory Visit: Payer: Federal, State, Local not specified - PPO | Admitting: Physician Assistant

## 2018-11-06 ENCOUNTER — Ambulatory Visit: Payer: Federal, State, Local not specified - PPO | Admitting: Family Medicine

## 2018-12-03 DIAGNOSIS — M2141 Flat foot [pes planus] (acquired), right foot: Secondary | ICD-10-CM | POA: Diagnosis not present

## 2018-12-03 DIAGNOSIS — M205X2 Other deformities of toe(s) (acquired), left foot: Secondary | ICD-10-CM | POA: Diagnosis not present

## 2018-12-03 DIAGNOSIS — M2142 Flat foot [pes planus] (acquired), left foot: Secondary | ICD-10-CM | POA: Diagnosis not present

## 2018-12-03 DIAGNOSIS — M205X1 Other deformities of toe(s) (acquired), right foot: Secondary | ICD-10-CM | POA: Diagnosis not present

## 2018-12-28 ENCOUNTER — Telehealth: Payer: Self-pay | Admitting: Radiology

## 2018-12-28 NOTE — Telephone Encounter (Signed)
Copied from [place] [id number]. Topic: Appointment Scheduling - Scheduling Inquiry for Clinic >> Dec 28, 2018  2:14 PM Lennox Solders wrote: Reason for CRM:pt is calling and has wet cough for about 3 wks. Pt has tried OTC cough drops and  rx cough med. Pt is requesting to see dr hunter today

## 2018-12-28 NOTE — Telephone Encounter (Signed)
Called patient and left a voicemail message asking patient to return phone call and make an appointment for either the Saturday clinic or he can be scheduled with Dr. Yong Channel for Monday

## 2018-12-28 NOTE — Telephone Encounter (Signed)
Please advise 

## 2018-12-29 ENCOUNTER — Ambulatory Visit: Payer: Federal, State, Local not specified - PPO | Admitting: Internal Medicine

## 2018-12-31 ENCOUNTER — Ambulatory Visit: Payer: Federal, State, Local not specified - PPO | Admitting: Family Medicine

## 2018-12-31 ENCOUNTER — Encounter: Payer: Self-pay | Admitting: Family Medicine

## 2018-12-31 ENCOUNTER — Ambulatory Visit (INDEPENDENT_AMBULATORY_CARE_PROVIDER_SITE_OTHER): Payer: Federal, State, Local not specified - PPO

## 2018-12-31 VITALS — BP 122/78 | HR 80 | Temp 98.2°F | Ht 66.5 in | Wt 185.8 lb

## 2018-12-31 DIAGNOSIS — R052 Subacute cough: Secondary | ICD-10-CM

## 2018-12-31 DIAGNOSIS — R05 Cough: Secondary | ICD-10-CM | POA: Diagnosis not present

## 2018-12-31 DIAGNOSIS — Z1211 Encounter for screening for malignant neoplasm of colon: Secondary | ICD-10-CM

## 2018-12-31 NOTE — Progress Notes (Signed)
PCP: Marin Olp, MD  Subjective:  Derek Murray is a 58 y.o. year old very pleasant male patient who presents with  symptoms including cough, sore throat  Started out clear now yellow or greenish or brownish. No blood.  Yesterday got into a coughing spasm Seems to get better and worse but yesterday was the worst of symptoms Also got some chills this morning Denies runny nose, sinus congestion.  Does have some frontal sinus pressure.  No fever or shortness of breath Mild fatigue.  History of pneumonia over a year ago Didn't do well on prednisone in 2018- actually seemed to flare up shoulder pain  - does not have wheeze as well -started: 3.5 weeks ago, symptoms are varying in course-better some days and worse than others -sick contacts/travel/risks: denies flu exposure.   ROS-denies fever, NVD, tooth pain. Denies significant shortness of breath.   Pertinent Past Medical History-  Patient Active Problem List   Diagnosis Date Noted  . Hyperbilirubinemia 11/03/2009    Priority: Medium  . Hyperlipidemia 09/27/2007    Priority: Medium  . Prostatitis 07/14/2014    Priority: Low  . History of adenomatous polyp of colon 11/03/2009    Priority: Low  . Umbilical hernia 51/76/1607    Priority: Low  . Tinnitus 09/29/2016    Medications- reviewed  Current Outpatient Medications  Medication Sig Dispense Refill  . simvastatin (ZOCOR) 10 MG tablet TAKE ONE TABLET BY MOUTH AT BEDTIME 90 tablet 1   No current facility-administered medications for this visit.     Objective: BP 122/78 (BP Location: Left Arm, Patient Position: Sitting, Cuff Size: Large)   Pulse 80   Temp 98.2 F (36.8 C) (Oral)   Ht 5' 6.5" (1.689 m)   Wt 185 lb 12.8 oz (84.3 kg)   SpO2 97%   BMI 29.54 kg/m  Gen: NAD, resting comfortably HEENT: Turbinates erythematous with clear discharge/drainage, TM normal, pharynx mildly erythematous with no tonsilar exudate or edema-some tonsillar edema tubular, no  sinus tenderness.  Tongue with a slight slit down the middle of approximately 1 cm CV: RRR no murmurs rubs or gallops Lungs: CTAB no crackles, wheeze, rhonchi  Ext: no edema Skin: warm, dry, no rash  Assessment/Plan:  Bronchitis History and exam today are suggestive of viral infection most likely due to bronchitis.  -Symptomatic treatment with: Rest, hydration, over-the-counter cough medications -Discussed prednisone:-Opts out-reports had worsening shoulder pain while on prednisone in the past - We got a chest x-ray to rule out pneumonia.  It looks very similar to me to last year in May when x-ray was read as mild bibasilar atelectasis  We discussed that we did not find any infection (as long as x-ray is normal without pneumonia) that had higher probability of being bacterial such as pneumonia, strep throat, ear infection, bacterial sinusitis. We discussed signs that bacterial infection may have developed particularly fever or shortness of breath and reasons for follow up (symptoms worsen, last past expected time frame, new concerns arise).  Likely course of 4-6 weeks. Patient is contagious and advised good handwashing and consideration of coughing into elbow and then washing hands  Since he is already 3-1/2 weeks in to illness we did discuss if he is not improving within 2 to 3 weeks that it would be reasonable to trial a course of doxycycline even if this is simply bronchitis  Subacute cough - Plan: DG Chest 2 View  Screen for colon cancer - Plan: Ambulatory referral to Gastroenterology Patient never heard from  GI about his recall for colonoscopy-he does report he had moved.  I submitted a new referral for him   [name], MD

## 2018-12-31 NOTE — Patient Instructions (Addendum)
Health Maintenance Due  Topic Date Due  . COLONOSCOPY - We will call you within two weeks about your referral to GI for colonoscopy. If you do not hear within 3 weeks, give [place] a call.    05/01/2018   Please stop by x-ray before you go  Lets make sure no pneumonia- if not this may be more of a bronchitis (which has a 95% chance of being viral). I would still say if no better in 2-3 weeks to reach out by mychart and we may consider something like doxycyline antibiotic

## 2019-01-02 ENCOUNTER — Encounter: Payer: Self-pay | Admitting: Family Medicine

## 2019-01-08 DIAGNOSIS — Z114 Encounter for screening for human immunodeficiency virus [HIV]: Secondary | ICD-10-CM | POA: Diagnosis not present

## 2019-01-08 DIAGNOSIS — E785 Hyperlipidemia, unspecified: Secondary | ICD-10-CM | POA: Diagnosis not present

## 2019-01-08 DIAGNOSIS — H9313 Tinnitus, bilateral: Secondary | ICD-10-CM | POA: Diagnosis not present

## 2019-01-08 DIAGNOSIS — Z125 Encounter for screening for malignant neoplasm of prostate: Secondary | ICD-10-CM | POA: Diagnosis not present

## 2019-01-08 DIAGNOSIS — Z1159 Encounter for screening for other viral diseases: Secondary | ICD-10-CM | POA: Diagnosis not present

## 2019-02-05 ENCOUNTER — Encounter: Payer: Self-pay | Admitting: Family Medicine

## 2019-02-08 ENCOUNTER — Other Ambulatory Visit: Payer: Self-pay | Admitting: Family Medicine

## 2019-04-18 IMAGING — DX DG CHEST 2V
2 series · 2 of 2 positions shown · non-contrast
Comparison: 02/02/2017

CLINICAL DATA: Cough for 3 weeks

EXAM:
CHEST - 2 VIEW

[chest pa]
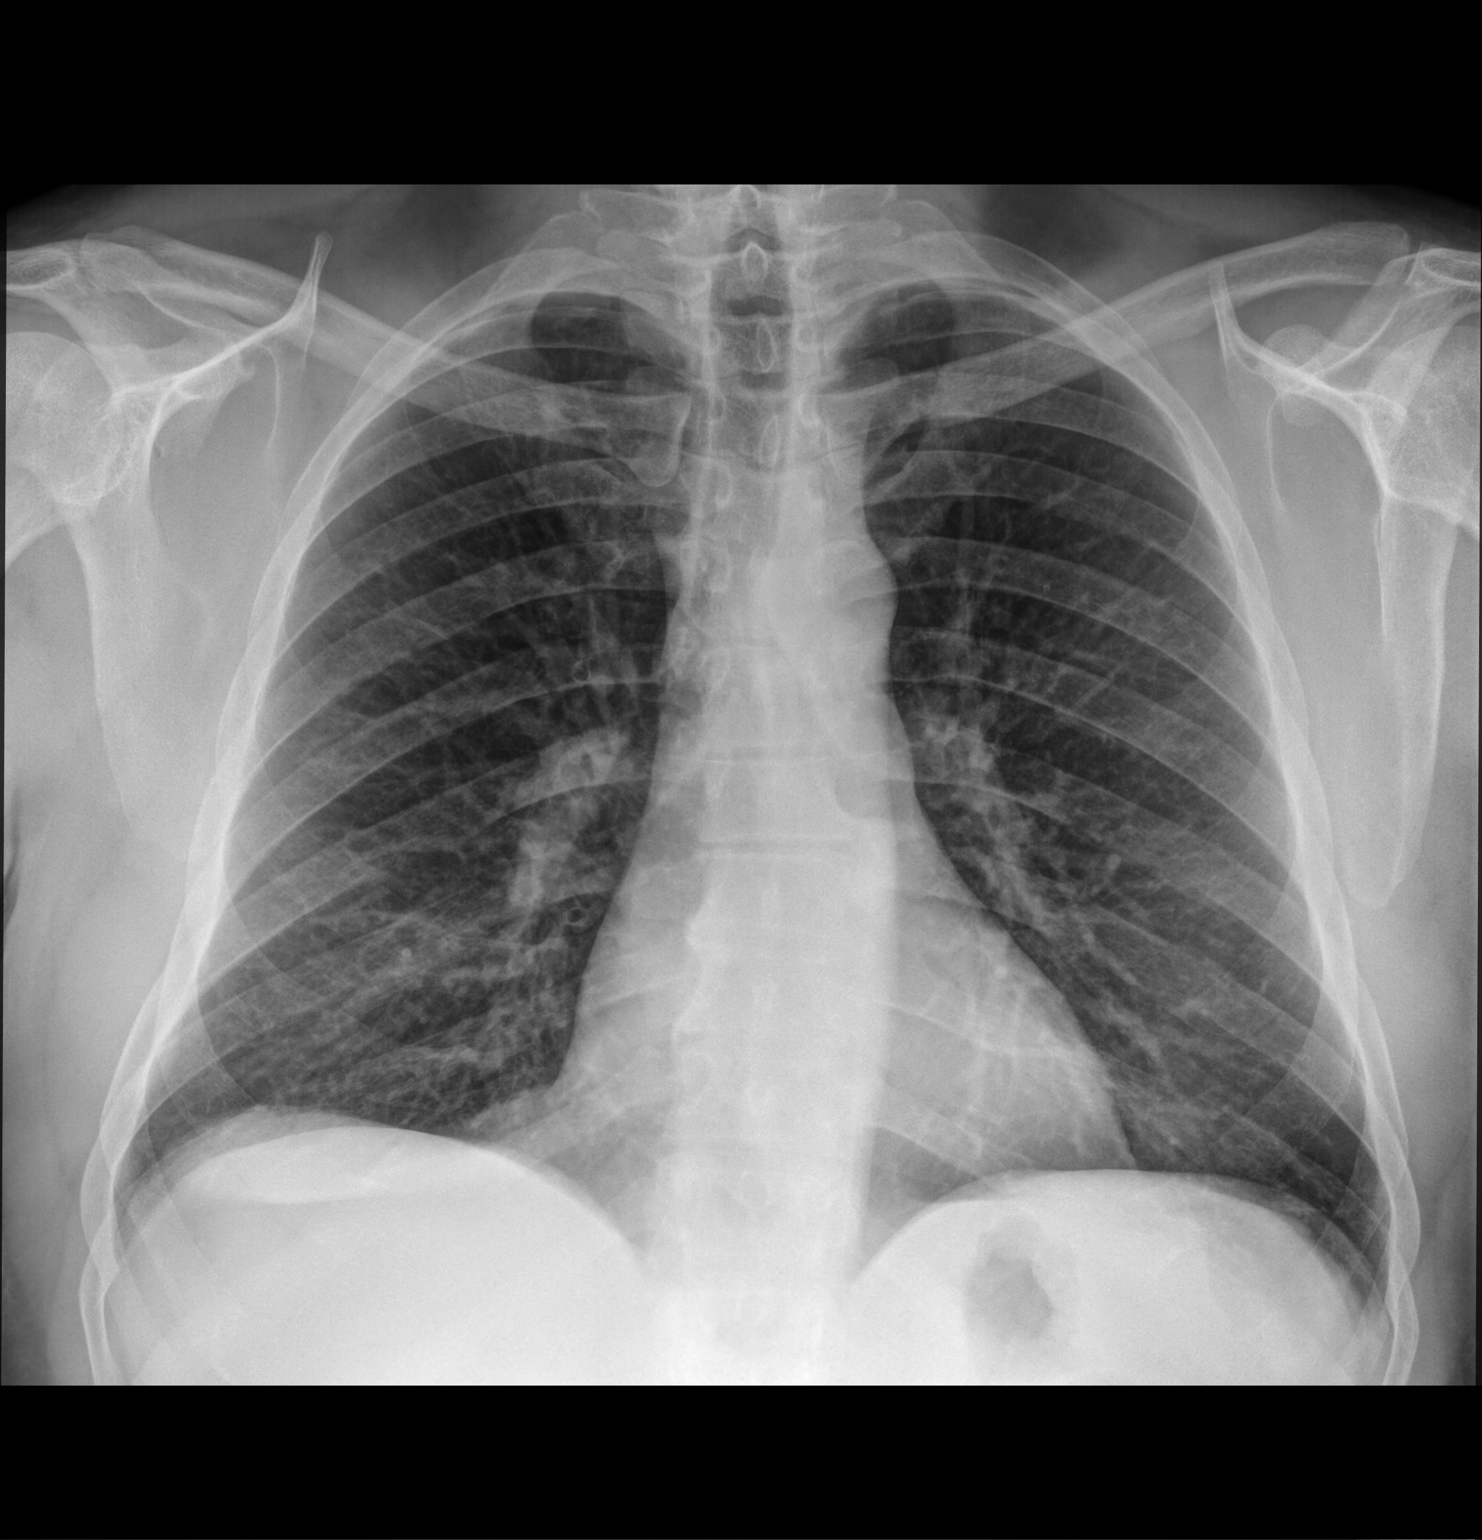

[chest lat]
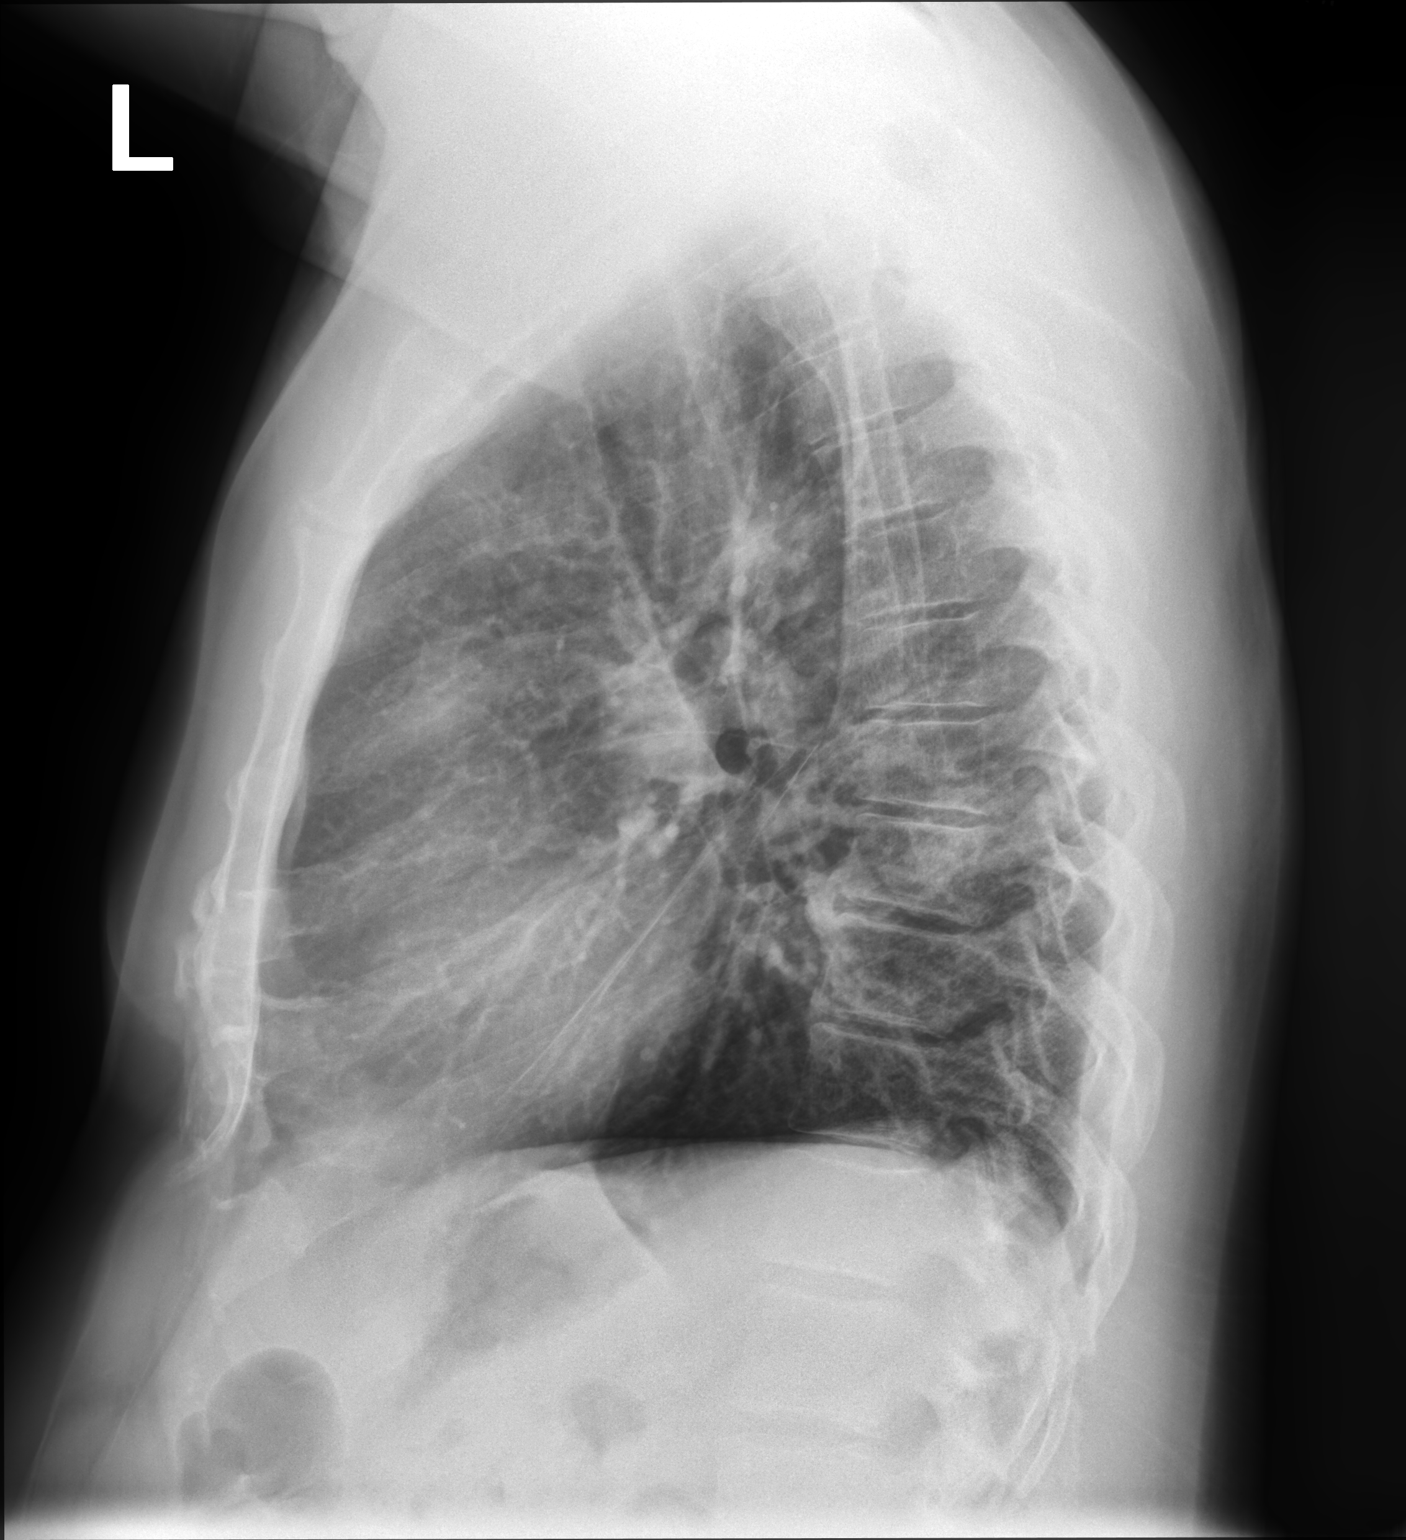

[2 of 2 positions shown; findings below may reference images not displayed]

FINDINGS: Normal heart size. Lungs clear. No pneumothorax. No pleural
effusion.
IMPRESSION: No active cardiopulmonary disease.

## 2019-08-13 ENCOUNTER — Other Ambulatory Visit: Payer: Self-pay | Admitting: Family Medicine

## 2019-10-17 ENCOUNTER — Encounter: Payer: Self-pay | Admitting: Gastroenterology

## 2019-11-22 ENCOUNTER — Other Ambulatory Visit: Payer: Self-pay

## 2019-11-22 ENCOUNTER — Ambulatory Visit (AMBULATORY_SURGERY_CENTER): Payer: Self-pay | Admitting: *Deleted

## 2019-11-22 VITALS — Ht 66.5 in | Wt 193.0 lb

## 2019-11-22 DIAGNOSIS — Z86010 Personal history of colonic polyps: Secondary | ICD-10-CM

## 2019-11-22 DIAGNOSIS — Z1159 Encounter for screening for other viral diseases: Secondary | ICD-10-CM

## 2019-11-22 MED ORDER — SUPREP BOWEL PREP KIT 17.5-3.13-1.6 GM/177ML PO SOLN: 1.0000 | Freq: Once | ORAL | 0 refills | Status: AC

## 2019-11-22 NOTE — Progress Notes (Signed)
Patient denies any allergies to egg or soy products. Patient denies complications with anesthesia/sedation.  Patient denies oxygen use at home and denies diet medications. Emmi instructions for colonoscopy/endoscopy explained and given to patient.  Suprep coupon given.   

## 2019-12-03 ENCOUNTER — Ambulatory Visit (INDEPENDENT_AMBULATORY_CARE_PROVIDER_SITE_OTHER): Payer: Federal, State, Local not specified - PPO

## 2019-12-03 ENCOUNTER — Other Ambulatory Visit: Payer: Self-pay | Admitting: Gastroenterology

## 2019-12-03 DIAGNOSIS — Z1159 Encounter for screening for other viral diseases: Secondary | ICD-10-CM | POA: Diagnosis not present

## 2019-12-04 LAB — SARS CORONAVIRUS 2 (TAT 6-24 HRS): SARS Coronavirus 2: NEGATIVE

## 2019-12-06 ENCOUNTER — Ambulatory Visit (AMBULATORY_SURGERY_CENTER): Payer: Federal, State, Local not specified - PPO | Admitting: Gastroenterology

## 2019-12-06 ENCOUNTER — Other Ambulatory Visit: Payer: Self-pay

## 2019-12-06 ENCOUNTER — Encounter: Payer: Self-pay | Admitting: Gastroenterology

## 2019-12-06 VITALS — BP 119/68 | HR 82 | Temp 98.0°F | Resp 22 | Ht 66.0 in | Wt 193.0 lb

## 2019-12-06 DIAGNOSIS — D122 Benign neoplasm of ascending colon: Secondary | ICD-10-CM

## 2019-12-06 DIAGNOSIS — Z1211 Encounter for screening for malignant neoplasm of colon: Secondary | ICD-10-CM | POA: Diagnosis not present

## 2019-12-06 DIAGNOSIS — Z86010 Personal history of colonic polyps: Secondary | ICD-10-CM | POA: Diagnosis not present

## 2019-12-06 DIAGNOSIS — D120 Benign neoplasm of cecum: Secondary | ICD-10-CM | POA: Diagnosis not present

## 2019-12-06 MED ORDER — SODIUM CHLORIDE 0.9 % IV SOLN: 500.0000 mL | Freq: Once | INTRAVENOUS | Status: DC

## 2019-12-06 NOTE — Progress Notes (Signed)
To PACU, VSS. Report to [email or web address] 

## 2019-12-06 NOTE — Op Note (Signed)
[facility] Patient Name: Nolton Marcheschi Procedure Date: 12/06/2019 9:22 AM MRN: [id number] Endoscopist: Remo Lipps P. Havery Moros , MD Age: 58 Referring MD:  Date of Birth: 09/09/1961 Gender: Male Account #: [id number] Procedure:                Colonoscopy Indications:              High risk colon cancer surveillance: Personal                            history of colonic polyps (adenoma removed in 2014) Medicines:                Monitored Anesthesia Care Procedure:                Pre-Anesthesia Assessment:                           - Prior to the procedure, a History and Physical                            was performed, and patient medications and                            allergies were reviewed. The patient's tolerance of                            previous anesthesia was also reviewed. The risks                            and benefits of the procedure and the sedation                            options and risks were discussed with the patient.                            All questions were answered, and informed consent                            was obtained. Prior Anticoagulants: The patient has                            taken no previous anticoagulant or antiplatelet                            agents. ASA Grade Assessment: II - A patient with                            mild systemic disease. After reviewing the risks                            and benefits, the patient was deemed in                            satisfactory condition to undergo the procedure.  After obtaining informed consent, the colonoscope                            was passed under direct vision. Throughout the                            procedure, the patient's blood pressure, pulse, and                            oxygen saturations were monitored continuously. The                            Colonoscope was introduced through the anus and   advanced to the the terminal ileum, with                            identification of the appendiceal orifice and IC                            valve. The colonoscopy was performed without                            difficulty. The patient tolerated the procedure                            well. The quality of the bowel preparation was                            good. The terminal ileum, ileocecal valve,                            appendiceal orifice, and rectum were photographed. Scope In: 9:35:24 AM Scope Out: 9:58:56 AM Scope Withdrawal Time: 0 hours 20 minutes 36 seconds  Total Procedure Duration: 0 hours 23 minutes 32 seconds  Findings:                 The perianal and digital rectal examinations were                            normal.                           The terminal ileum appeared normal.                           Four sessile polyps were found in the cecum. The                            polyps were 2 to 4 mm in size. These polyps were                            removed with a cold snare. Resection and retrieval                            were complete.  An area of nodular mucosa was found at the                            ileocecal valve entering the ileum - I suspect                            normal variant but biopsies were taken with a cold                            forceps for histology to ensure no adenomatous                            change.                           Two sessile polyps were found in the ascending                            colon. The polyps were 3 mm in size. These polyps                            were removed with a cold snare. Resection and                            retrieval were complete.                           The exam was otherwise without abnormality. Complications:            No immediate complications. Estimated blood loss:                            Minimal. Estimated Blood Loss:     Estimated blood loss  was minimal. Impression:               - The examined portion of the ileum was normal.                           - Four 2 to 4 mm polyps in the cecum, removed with                            a cold snare. Resected and retrieved.                           - Nodular mucosa at the ileocecal valve - suspect                            normal variant, biopsied to rule out adenomatous                            change.                           - Two 3 mm polyps in the ascending colon, removed  with a cold snare. Resected and retrieved.                           - The examination was otherwise normal. Recommendation:           - Patient has a contact number available for                            emergencies. The signs and symptoms of potential                            delayed complications were discussed with the                            patient. Return to normal activities tomorrow.                            Written discharge instructions were provided to the                            patient.                           - Resume previous diet.                           - Continue present medications.                           - Await pathology results. Remo Lipps P. Jaimon Bugaj, MD 12/06/2019 10:04:48 AM This report has been signed electronically.

## 2019-12-06 NOTE — Patient Instructions (Signed)
Biopsies were taken from your ileocecal valve and 6 polyps were removed. Please go to myChart in 1-3 weeks for more information.       YOU HAD AN ENDOSCOPIC PROCEDURE TODAY AT [facility]:   Refer to the procedure report that was given to you for any specific questions about what was found during the examination.  If the procedure report does not answer your questions, please call your gastroenterologist to clarify.  If you requested that your care partner not be given the details of your procedure findings, then the procedure report has been included in a sealed envelope for you to review at your convenience later.  YOU SHOULD EXPECT: Some feelings of bloating in the abdomen. Passage of more gas than usual.  Walking can help get rid of the air that was put into your GI tract during the procedure and reduce the bloating. If you had a lower endoscopy (such as a colonoscopy or flexible sigmoidoscopy) you may notice spotting of blood in your stool or on the toilet paper. If you underwent a bowel prep for your procedure, you may not have a normal bowel movement for a few days.  Please Note:  You might notice some irritation and congestion in your nose or some drainage.  This is from the oxygen used during your procedure.  There is no need for concern and it should clear up in a day or so.  SYMPTOMS TO REPORT IMMEDIATELY:   Following lower endoscopy (colonoscopy or flexible sigmoidoscopy):  Excessive amounts of blood in the stool  Significant tenderness or worsening of abdominal pains  Swelling of the abdomen that is new, acute  Fever of 100F or higher   For urgent or emergent issues, a gastroenterologist can be reached at any hour by calling [phone].   DIET:  We do recommend a small meal at first, but then you may proceed to your regular diet.  Drink plenty of fluids but you should avoid alcoholic beverages for 24 hours.  ACTIVITY:  You should plan to take it easy  for the rest of today and you should NOT DRIVE or use heavy machinery until tomorrow (because of the sedation medicines used during the test).    FOLLOW UP: Our staff will call the number listed on your records 48-72 hours following your procedure to check on you and address any questions or concerns that you may have regarding the information given to you following your procedure. If we do not reach you, we will leave a message.  We will attempt to reach you two times.  During this call, we will ask if you have developed any symptoms of COVID 19. If you develop any symptoms (ie: fever, flu-like symptoms, shortness of breath, cough etc.) before then, please call [phone].  If you test positive for Covid 19 in the 2 weeks post procedure, please call and report this information to [place].    If any biopsies were taken you will be contacted by phone or by letter within the next 1-3 weeks.  Please call us at [phone] if you have not heard about the biopsies in 3 weeks.    SIGNATURES/CONFIDENTIALITY: You and/or your care partner have signed paperwork which will be entered into your electronic medical record.  These signatures attest to the fact that that the information above on your After Visit Summary has been reviewed and is understood.  Full responsibility of the confidentiality of this discharge information lies with you and/or your  care-partner.

## 2019-12-06 NOTE — Progress Notes (Signed)
Called to room to assist during endoscopic procedure.  Patient ID and intended procedure confirmed with present staff. Received instructions for my participation in the procedure from the performing physician.  

## 2019-12-06 NOTE — Progress Notes (Signed)
Pt's states no medical or surgical changes since previsit or office visit.  Temp JB VS CW 

## 2019-12-10 ENCOUNTER — Telehealth: Payer: Self-pay | Admitting: *Deleted

## 2019-12-10 NOTE — Telephone Encounter (Signed)
  Follow up Call-  Call back number 12/06/2019  Post procedure Call Back phone  # [phone]  Permission to leave phone message Yes  Some recent data might be hidden     Patient questions:  Do you have a fever, pain , or abdominal swelling? No. Pain Score  0 *  Have you tolerated food without any problems? Yes.    Have you been able to return to your normal activities? Yes.    Do you have any questions about your discharge instructions: Diet   No. Medications  No. Follow up visit  No.  Do you have questions or concerns about your Care? No.  Actions: * If pain score is 4 or above: 1. No action needed, pain <4.Have you developed a fever since your procedure? no  2.   Have you had an respiratory symptoms (SOB or cough) since your procedure? no  3.   Have you tested positive for COVID 19 since your procedure no  4.   Have you had any family members/close contacts diagnosed with the COVID 19 since your procedure?  no   If yes to any of these questions please route to Joylene John, RN and Alphonsa Gin, Therapist, sports.

## 2020-02-19 ENCOUNTER — Other Ambulatory Visit: Payer: Self-pay | Admitting: Family Medicine

## 2020-04-02 DIAGNOSIS — Z87891 Personal history of nicotine dependence: Secondary | ICD-10-CM | POA: Diagnosis not present

## 2020-04-02 DIAGNOSIS — Z0000 Encounter for general adult medical examination without abnormal findings: Secondary | ICD-10-CM | POA: Diagnosis not present

## 2020-04-02 DIAGNOSIS — R21 Rash and other nonspecific skin eruption: Secondary | ICD-10-CM | POA: Diagnosis not present

## 2020-04-02 DIAGNOSIS — E785 Hyperlipidemia, unspecified: Secondary | ICD-10-CM | POA: Diagnosis not present

## 2020-06-04 DIAGNOSIS — L811 Chloasma: Secondary | ICD-10-CM | POA: Diagnosis not present

## 2020-06-04 DIAGNOSIS — L821 Other seborrheic keratosis: Secondary | ICD-10-CM | POA: Diagnosis not present

## 2020-06-04 DIAGNOSIS — D239 Other benign neoplasm of skin, unspecified: Secondary | ICD-10-CM | POA: Diagnosis not present

## 2020-08-15 ENCOUNTER — Other Ambulatory Visit: Payer: Self-pay | Admitting: Family Medicine

## 2020-09-17 DIAGNOSIS — U071 COVID-19: Secondary | ICD-10-CM | POA: Diagnosis not present

## 2020-09-24 DIAGNOSIS — E785 Hyperlipidemia, unspecified: Secondary | ICD-10-CM | POA: Diagnosis not present

## 2020-12-19 HISTORY — PX: TRIGGER FINGER RELEASE: SHX641

## 2020-12-22 DIAGNOSIS — Z20828 Contact with and (suspected) exposure to other viral communicable diseases: Secondary | ICD-10-CM | POA: Diagnosis not present

## 2020-12-29 DIAGNOSIS — U071 COVID-19: Secondary | ICD-10-CM | POA: Diagnosis not present

## 2021-01-08 ENCOUNTER — Telehealth (INDEPENDENT_AMBULATORY_CARE_PROVIDER_SITE_OTHER): Payer: Federal, State, Local not specified - PPO | Admitting: Physician Assistant

## 2021-01-08 ENCOUNTER — Encounter: Payer: Self-pay | Admitting: Physician Assistant

## 2021-01-08 VITALS — Ht 66.0 in | Wt 185.0 lb

## 2021-01-08 DIAGNOSIS — U071 COVID-19: Secondary | ICD-10-CM

## 2021-01-08 NOTE — Progress Notes (Signed)
Virtual Visit via Video   I connected with Derek Murray on 01/08/21 at  1:30 PM EST by a video enabled telemedicine application and verified that I am speaking with the correct person using two identifiers. Location patient: Home Location provider: [facility], Office Persons participating in the virtual visit: Derek Murray, Derek Coke PA-C, Derek Pickler, LPN   I discussed the limitations of evaluation and management by telemedicine and the availability of in person appointments. The patient expressed understanding and agreed to proceed.  I acted as a Education administrator for [employer], [employer], LPN   Subjective:   HPI:   COVID-19 Symptom onset: 12/24/2020  Travel/contacts: friend was positive  Vaccination status: Complete  Testing results: Pt had test done at [facility] was positive on 1/16   Symptoms are getting better with time. Only major symptom at this point is a dry cough, that can come in spasms. These spasms are decreasing with time.  Patient denies the following symptoms: Fever or chills, headache Denies SOB or cough, malaise, dizziness  Treatments tried: Mucinex, Robitussin NIght time Max  Patient risk factors: Current [id number] risk of complications score: 2 Smoking status: Derek Murray  reports that he quit smoking about 27 years ago. He has a 1.00 pack-year smoking history. He has never used smokeless tobacco. If male, currently pregnant? []   Yes []   No  ROS: See pertinent positives and negatives per HPI.  Patient Active Problem List   Diagnosis Date Noted  . Tinnitus 09/29/2016  . Prostatitis 07/14/2014  . History of adenomatous polyp of colon 11/03/2009  . Hyperbilirubinemia 11/03/2009  . Umbilical hernia 82/95/6213  . Hyperlipidemia 09/27/2007    Social History   Tobacco Use  . Smoking status: Former Smoker    Packs/day: 0.10    Years: 10.00    Pack years: 1.00    Quit date: 12/19/1993    Years since quitting: 27.0   . Smokeless tobacco: Never Used  . Tobacco comment: social smoking  Substance Use Topics  . Alcohol use: No    Current Outpatient Medications:  .  simvastatin (ZOCOR) 10 MG tablet, TAKE ONE TABLET BY MOUTH AT BEDTIME, Disp: 90 tablet, Rfl: 1  Allergies  Allergen Reactions  . Prednisone Other (See Comments)    "severe arm pain"    Objective:   VITALS: Per patient if applicable, see vitals. GENERAL: Alert, appears well and in no acute distress. HEENT: Atraumatic, conjunctiva clear, no obvious abnormalities on inspection of external nose and ears. NECK: Normal movements of the head and neck. CARDIOPULMONARY: No increased WOB. Speaking in clear sentences. I:E ratio WNL.  MS: Moves all visible extremities without noticeable abnormality. PSYCH: Pleasant and cooperative, well-groomed. Speech normal rate and rhythm. Affect is appropriate. Insight and judgement are appropriate. Attention is focused, linear, and appropriate.  NEURO: CN grossly intact. Oriented as arrived to appointment on time with no prompting. Moves both UE equally.  SKIN: No obvious lesions, wounds, erythema, or cyanosis noted on face or hands.  Assessment and Plan:   [name] was seen today for covid positive.  Diagnoses and all orders for this visit:  COVID-19    No red flags on discussion, patient is not in any obvious distress during our visit. Discussed progression of most viral illnesses, and recommended supportive care at this point in time.  He is doing quite well with this course. Offered prn albuterol inhaler but he does not feel like that is indicated at this time. Discussed over the counter  supportive care options, including Tylenol 500 mg q 8 hours, with recommendations to push fluids and rest. Reviewed return precautions including new/worsening fever, SOB, new/worsening cough, sudden onset changes of symptoms. Recommended need to self-quarantine and practice social distancing until symptoms  resolve. I recommend that patient follow-up if symptoms worsen or persist despite treatment x 7-10 days, sooner if needed.  I discussed the assessment and treatment plan with the patient. The patient was provided an opportunity to ask questions and all were answered. The patient agreed with the plan and demonstrated an understanding of the instructions.   The patient was advised to call back or seek an in-person evaluation if the symptoms worsen or if the condition fails to improve as anticipated.   CMA or LPN served as scribe during this visit. History, Physical, and Plan performed by medical provider. The above documentation has been reviewed and is accurate and complete.   [place], [place] 01/08/2021

## 2021-01-20 DIAGNOSIS — F17210 Nicotine dependence, cigarettes, uncomplicated: Secondary | ICD-10-CM | POA: Diagnosis not present

## 2021-01-20 DIAGNOSIS — M25562 Pain in left knee: Secondary | ICD-10-CM | POA: Diagnosis not present

## 2021-01-20 DIAGNOSIS — I10 Essential (primary) hypertension: Secondary | ICD-10-CM | POA: Diagnosis not present

## 2021-01-20 DIAGNOSIS — Z79899 Other long term (current) drug therapy: Secondary | ICD-10-CM | POA: Diagnosis not present

## 2021-01-20 DIAGNOSIS — Z791 Long term (current) use of non-steroidal anti-inflammatories (NSAID): Secondary | ICD-10-CM | POA: Diagnosis not present

## 2021-01-20 DIAGNOSIS — M109 Gout, unspecified: Secondary | ICD-10-CM | POA: Diagnosis not present

## 2021-01-20 DIAGNOSIS — M25462 Effusion, left knee: Secondary | ICD-10-CM | POA: Diagnosis not present

## 2021-01-20 DIAGNOSIS — M25572 Pain in left ankle and joints of left foot: Secondary | ICD-10-CM | POA: Diagnosis not present

## 2021-01-20 DIAGNOSIS — M25431 Effusion, right wrist: Secondary | ICD-10-CM | POA: Diagnosis not present

## 2021-02-26 ENCOUNTER — Other Ambulatory Visit: Payer: Self-pay | Admitting: Family Medicine

## 2021-02-26 NOTE — Telephone Encounter (Signed)
Please schedule an appointment to see Dr.Hunter for further refills

## 2021-04-05 ENCOUNTER — Other Ambulatory Visit: Payer: Self-pay | Admitting: Family Medicine

## 2021-04-05 NOTE — Telephone Encounter (Signed)
Schedule an appointment with Dr.Hunter for further refills.

## 2021-05-04 ENCOUNTER — Telehealth: Payer: Self-pay

## 2021-05-04 MED ORDER — SIMVASTATIN 10 MG PO TABS
ORAL_TABLET | ORAL | 1 refills | Status: DC
Start: 2021-05-04 — End: 2021-06-17

## 2021-05-04 NOTE — Telephone Encounter (Signed)
Rx refilled.

## 2021-05-04 NOTE — Telephone Encounter (Signed)
Pt scheduled an appt in Dr. Ronney Lion first available, June 30th, for medication management. Pt is needing a refill of him simvastatin. Please advise.   MEDICATION: simvastatin (ZOCOR) 10 MG tablet  PHARMACY: [facility] in [facility] on [address]  Comments:   **Let patient know to contact pharmacy at the end of the day to make sure medication is ready. **  ** Please notify patient to allow 48-72 hours to process**  **Encourage patient to contact the pharmacy for refills or they can request refills through [facility]**

## 2021-06-17 ENCOUNTER — Other Ambulatory Visit: Payer: Self-pay

## 2021-06-17 ENCOUNTER — Encounter: Payer: Self-pay | Admitting: Family Medicine

## 2021-06-17 ENCOUNTER — Ambulatory Visit: Payer: Federal, State, Local not specified - PPO | Admitting: Family Medicine

## 2021-06-17 VITALS — BP 130/79 | HR 70 | Temp 98.8°F | Ht 66.0 in | Wt 182.8 lb

## 2021-06-17 DIAGNOSIS — Z1283 Encounter for screening for malignant neoplasm of skin: Secondary | ICD-10-CM

## 2021-06-17 DIAGNOSIS — Z87891 Personal history of nicotine dependence: Secondary | ICD-10-CM

## 2021-06-17 DIAGNOSIS — B370 Candidal stomatitis: Secondary | ICD-10-CM

## 2021-06-17 DIAGNOSIS — Z0001 Encounter for general adult medical examination with abnormal findings: Secondary | ICD-10-CM | POA: Diagnosis not present

## 2021-06-17 DIAGNOSIS — Z125 Encounter for screening for malignant neoplasm of prostate: Secondary | ICD-10-CM

## 2021-06-17 DIAGNOSIS — E785 Hyperlipidemia, unspecified: Secondary | ICD-10-CM

## 2021-06-17 LAB — POC URINALSYSI DIPSTICK (AUTOMATED)
Bilirubin, UA: NEGATIVE
Blood, UA: NEGATIVE
Glucose, UA: NEGATIVE
Leukocytes, UA: NEGATIVE
Nitrite, UA: NEGATIVE
Protein, UA: POSITIVE — AB
Spec Grav, UA: >=1.03 — AB (ref 1.010–1.025)
Urobilinogen, UA: 1 E.U./dL
pH, UA: 6 (ref 5.0–8.0)

## 2021-06-17 MED ORDER — CLOTRIMAZOLE 10 MG MT TROC: 10.0000 mg | Freq: Every day | OROMUCOSAL | 0 refills | Status: DC

## 2021-06-17 MED ORDER — SIMVASTATIN 10 MG PO TABS: ORAL_TABLET | ORAL | 3 refills | Status: DC

## 2021-06-17 NOTE — Patient Instructions (Addendum)
In regards to your multiple trigger fingers, you could try the topical anti-inflammatory Voltaren Gel to help with the inflammation- up to 4 times a day for one week. You could also either try a finger splint or double band-aid splint method as instructed. If symptoms persist or worsen, please callu s and let us know for a referral for sports medicine.  In regards for your exercise routine, it is advised you try to aim for at least 150 minutes per week of exercise.  We will call you within two weeks about your referral to [facility] if covered by [facility]- if not covered, we can look for another option. If you do not hear within 2 weeks, give [place] a call.   In regards for you thrush, we will treat with for two weeks. Please update if symptoms improve. If there is no improvement, please let us know so we can place a referral for an oral surgeon for potential biopsy/their opinion  Please stop by lab before you go If you have mychart- we will send your results within 3 business days of [place] receiving them.  If you do not have mychart- we will call you about results within 5 business days of [place] receiving them.  *please also note that you will see labs on mychart as soon as they post. I will later go in and write notes on them- will say "notes from Dr. Yong Channel"  Recommended follow up: Return in about 1 year (around 06/17/2022) for physical or sooner if needed.

## 2021-06-17 NOTE — Progress Notes (Signed)
  Phone [phone] In person visit   Subjective:   Derek Murray is a 60 y.o. year old very pleasant male patient who presents for/with See problem oriented charting- thrush  This visit occurred during the SARS-CoV-2 public health emergency.  Safety protocols were in place, including screening questions prior to the visit, additional usage of staff PPE, and extensive cleaning of exam room while observing appropriate contact time as indicated for disinfecting solutions.   Past Medical History-  Patient Active Problem List   Diagnosis Date Noted   Hyperbilirubinemia 11/03/2009    Priority: Medium   Hyperlipidemia 09/27/2007    Priority: Medium   Prostatitis 07/14/2014    Priority: Low   History of adenomatous polyp of colon 11/03/2009    Priority: Low   Umbilical hernia 77/93/9030    Priority: Low   Tinnitus 09/29/2016    Medications- reviewed and updated Current Outpatient Medications  Medication Sig Dispense Refill   simvastatin (ZOCOR) 10 MG tablet TAKE ONE TABLET BY MOUTH AT BEDTIME 90 tablet 3   No current facility-administered medications for this visit.     Objective:  BP 130/79   Pulse 70   Temp 98.8 F (37.1 C) (Temporal)   Ht 5\' 6"  (1.676 m)   Wt 182 lb 12.8 oz (82.9 kg)   SpO2 95%   BMI 29.50 kg/m  Gen: NAD, resting comfortably Whitish discoloration on tongue sparing the tip-extends underneath the tongue in several areas.  No other area in oropharynx affected.  Patient does not report tenderness in area     Assessment and Plan   # whitish tongue S: Patient has noted whitish discoloration on his tongue for several months.  He has seen his dentist and discussed with him-they thought possible thrush but they were not majorly concerned-no treatment was initiated at that time.  Patient also has an area on the tip of the tongue that appears to be spared by this. A/P: Concern for possible thrush-we will treat with clotrimazole troche for 2 weeks-I want him  to update me after treatment as to whether he improved.  If no improvement in symptoms consider referral to oral surgeon for further evaluation-potential for biopsy  Recommended follow up: Return in about 1 year (around 06/17/2022) for physical or sooner if needed. Or soone rif needed if doesn't improve  Lab/Order associations:   ICD-10-CM   1. Thrush, oral  B37.0         clotrimazole (MYCELEX) 10 MG troche    Sig: Take 1 tablet (10 mg total) by mouth 5 (five) times daily.    Dispense:  70 Troche    Refill:  0    Return precautions advised.  [name], MD

## 2021-06-17 NOTE — Progress Notes (Signed)
Phone: [phone]   Subjective:  Patient presents today for their annual physical. Chief complaint-noted.   See problem oriented charting- Review of Systems  Constitutional:  Negative for chills and fever.  HENT:  Positive for tinnitus (no recent worsening). Negative for hearing loss.   Eyes:  Negative for blurred vision and double vision.  Respiratory:  Negative for cough and shortness of breath.   Cardiovascular:  Negative for chest pain and palpitations.  Gastrointestinal:  Negative for heartburn, nausea and vomiting.  Genitourinary:  Negative for dysuria and frequency.  Musculoskeletal:  Positive for joint pain (trigger finger). Negative for back pain and myalgias.  Skin:  Negative for itching and rash.  Neurological:  Negative for dizziness and headaches.  Endo/Heme/Allergies:  Negative for polydipsia. Does not bruise/bleed easily.  Psychiatric/Behavioral:  Negative for depression and suicidal ideas.    The following were reviewed and entered/updated in epic: Past Medical History:  Diagnosis Date   History of hepatitis B    1983. treated. cleared.    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-normal u/s bladder and kidneys, normal psa   Umbilical hernia    s/p surgery   Wears glasses    Patient Active Problem List   Diagnosis Date Noted   Hyperbilirubinemia 11/03/2009    Priority: Medium   Hyperlipidemia 09/27/2007    Priority: Medium   Prostatitis 07/14/2014    Priority: Low   History of adenomatous polyp of colon 11/03/2009    Priority: Low   Umbilical hernia 17/51/0258    Priority: Low   Tinnitus 09/29/2016   Past Surgical History:  Procedure Laterality Date   COLONOSCOPY  2014   1 TA - [facility]   EYE SURGERY     scar tissue    HYPOSPADIAS CORRECTION     INSERTION OF MESH N/A 03/12/2015   Procedure: INSERTION OF MESH;  Surgeon: Donnie Mesa, MD;  Location: [facility];  Service: General;  Laterality: N/A;   UMBILICAL HERNIA REPAIR N/A 03/12/2015   Procedure: UMBILICAL HERNIA REPAIR WITH MESH;  Surgeon: Donnie Mesa, MD;  Location: [facility];  Service: General;  Laterality: N/A;    Family History  Problem Relation Age of Onset   Diabetes Other    Hypertension Other    Prostate cancer Other    Stroke Brother 19       nonsmoker. overweight   Hypertension Mother    Diabetes Mother    CAD Father        nonsmoker. 64 onset died 37   Hypertension Father    Diabetes Father    CAD Brother        half brother   Esophageal cancer Neg Hx    Stomach cancer Neg Hx    Rectal cancer Neg Hx    Colon cancer Neg Hx     Medications- reviewed and updated Current Outpatient Medications  Medication Sig Dispense Refill   clotrimazole (MYCELEX) 10 MG troche Take 1 tablet (10 mg total) by mouth 5 (five) times daily. 70 Troche 0   simvastatin (ZOCOR) 10 MG tablet TAKE ONE TABLET BY MOUTH AT BEDTIME 90 tablet 3   No current facility-administered medications for this visit.    Allergies-reviewed and updated Allergies  Allergen Reactions   Prednisone Other (See Comments)    "severe arm pain"    Social History   Social History Narrative  Single lives alone - in relationship. 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 Oropharynx normal other than ptoential thrush- see separate note Neck: no thyromegaly CV: RRR no murmurs rubs or gallops Lungs: CTAB no crackles, wheeze, rhonchi Abdomen: soft/nontender/nondistended/normal bowel sounds. No rebound or guarding.  Ext: no  edema Skin: warm, dry Neuro: grossly normal, moves all extremities, PERRLA   Assessment and Plan  60 y.o. male presenting for annual physical.  Health Maintenance counseling: 1. Anticipatory guidance: Patient counseled regarding regular dental exams -q6 months, eye exams -yearly,  avoiding smoking and second hand smoke , limiting alcohol to 2 beverages per day - doesn't drink.   2. Risk factor reduction:  Advised patient of need for regular exercise and diet rich and fruits and vegetables to reduce risk of heart attack and stroke. Exercise- trigger fingers have been a barrier- he is hoping improving on this will help- advised 150 minutes a week. Diet-patient has been improving on his diet plus has been fasting since yesterday. Eating healthier foods and avoiding eating late- down about 11 lbs from last check dec 2020 Wt Readings from Last 3 Encounters:  06/17/21 182 lb 12.8 oz (82.9 kg)  01/08/21 185 lb (83.9 kg)  12/06/19 193 lb (87.5 kg)  3. Immunizations/screenings/ancillary studies- he is planning on waiting on covid shot #4 Immunization History  Administered Date(s) Administered   PFIZER(Purple Top)SARS-COV-2 Vaccination 03/14/2020, 04/04/2020, 11/23/2020   Td 04/28/2009  4. Prostate cancer screening- low risk prior psa trend- update with labs today  Lab Results  Component Value Date   PSA 1.37 02/09/2018   PSA 1.29 09/01/2016   PSA 1.04 09/08/2014   5. Colon cancer screening - 12/06/2019 with 3 year repeat planned due to polyps 6. Skin cancer screening- lower risk due to melanin content- will place a referral to dermatologist for their opinion at his request- had seen [place] previously. advised regular sunscreen use. Denies worrisome, changing, or new skin lesions.  7. former smoker- 1 pack year quit in 1990s- no regular screening required 8. STD screening - declines screening  Status of chronic or acute concerns   #hyperlipidemia S: Medication: Simvastatin 10 mg Lab Results   Component Value Date   CHOL 217 (H) 02/09/2018   HDL 44.50 02/09/2018   LDLCALC 140 (H) 02/09/2018   LDLDIRECT 141.3 02/08/2013   TRIG 165.0 (H) 02/09/2018   CHOLHDL 5 02/09/2018   A/P: hopefully stable or improved- update lipid panel today. Continue current meds for now. Prefer LDL under 70 most ideally but could look back- secondary goal would b e at least 30% reduction from peak  # Trigger fingers S:patient has had several fingers- 2 on each hand including pinkie on left hand and pinky on right and middle finger on both hands. This started after shoveling a large volume several months ago. Has gotten slightly better with improving diet- was trying to decrease inflammation  A/P: Patient with multiple trigger fingers-discussed double Band-Aid splinting and trial of Voltaren gel up to 4 times a day for perhaps a week to try to  calm this down.  If recurrent issues could refer to sports medicine-he will let us know.  Recommended follow up: Return in about 1 year (around 06/17/2022) for physical or sooner if needed.  Lab/Order associations: fasting   ICD-10-CM   1. Abnormal physical evaluation  Z00.01     2. Hyperlipidemia, unspecified hyperlipidemia type  E78.5 CBC with Differential/Platelet    Comprehensive metabolic panel    Lipid panel    POCT Urinalysis Dipstick (Automated)    3. Screening for skin cancer  Z12.83 Ambulatory referral to Dermatology    4. Screening for prostate cancer  Z12.5 PSA    5. Former smoker  Z87.891 POCT Urinalysis Dipstick (Automated)    6. Thrush, oral  B37.0      Meds ordered this encounter  Medications   simvastatin (ZOCOR) 10 MG tablet    Sig: TAKE ONE TABLET BY MOUTH AT BEDTIME    Dispense:  90 tablet    Refill:  3   clotrimazole (MYCELEX) 10 MG troche    Sig: Take 1 tablet (10 mg total) by mouth 5 (five) times daily.    Dispense:  70 Troche    Refill:  0  Clotrimazole actually ordered for separate problem oriented note  Return precautions  advised.  [name], MD

## 2021-06-17 NOTE — Addendum Note (Signed)
Addended by: Loura Back on: 06/17/2021 04:40 PM   Modules accepted: Orders

## 2021-06-18 LAB — CBC WITH DIFFERENTIAL/PLATELET
Basophils Absolute: 0.1 K/uL (ref 0.0–0.1)
Basophils Relative: 1 % (ref 0.0–3.0)
Eosinophils Absolute: 0.6 K/uL (ref 0.0–0.7)
Eosinophils Relative: 11.2 % — ABNORMAL HIGH (ref 0.0–5.0)
HCT: 46.4 % (ref 39.0–52.0)
Hemoglobin: 15.6 g/dL (ref 13.0–17.0)
Lymphocytes Relative: 27.7 % (ref 12.0–46.0)
Lymphs Abs: 1.5 K/uL (ref 0.7–4.0)
MCHC: 33.7 g/dL (ref 30.0–36.0)
MCV: 90.3 fl (ref 78.0–100.0)
Monocytes Absolute: 0.5 K/uL (ref 0.1–1.0)
Monocytes Relative: 9 % (ref 3.0–12.0)
Neutro Abs: 2.8 K/uL (ref 1.4–7.7)
Neutrophils Relative %: 51.1 % (ref 43.0–77.0)
Platelets: 183 K/uL (ref 150.0–400.0)
RBC: 5.14 Mil/uL (ref 4.22–5.81)
RDW: 12.3 % (ref 11.5–15.5)
WBC: 5.4 K/uL (ref 4.0–10.5)

## 2021-06-18 LAB — COMPREHENSIVE METABOLIC PANEL
ALT: 28 U/L (ref 0–53)
AST: 24 U/L (ref 0–37)
Albumin: 5.1 g/dL (ref 3.5–5.2)
Alkaline Phosphatase: 80 U/L (ref 39–117)
BUN: 13 mg/dL (ref 6–23)
CO2: 26 mEq/L (ref 19–32)
Calcium: 10.2 mg/dL (ref 8.4–10.5)
Chloride: 106 mEq/L (ref 96–112)
Creatinine, Ser: 1.08 mg/dL (ref 0.40–1.50)
GFR: 74.91 mL/min (ref >60.00)
Glucose, Bld: 85 mg/dL (ref 70–99)
Potassium: 4.2 mEq/L (ref 3.5–5.1)
Sodium: 143 mEq/L (ref 135–145)
Total Bilirubin: 2.1 mg/dL — ABNORMAL HIGH (ref 0.2–1.2)
Total Protein: 7.7 g/dL (ref 6.0–8.3)

## 2021-06-18 LAB — LIPID PANEL
Cholesterol: 196 mg/dL (ref 0–200)
HDL: 43 mg/dL (ref >39.00)
LDL Cholesterol: 125 mg/dL — ABNORMAL HIGH (ref 0–99)
NonHDL: 153.18
Total CHOL/HDL Ratio: 5
Triglycerides: 141 mg/dL (ref 0.0–149.0)
VLDL: 28.2 mg/dL (ref 0.0–40.0)

## 2021-06-18 LAB — PSA: PSA: 1.49 ng/mL (ref 0.10–4.00)

## 2021-06-18 NOTE — Progress Notes (Signed)
LMTCB

## 2021-08-31 DIAGNOSIS — K080 Exfoliation of teeth due to systemic causes: Secondary | ICD-10-CM | POA: Diagnosis not present

## 2021-09-30 DIAGNOSIS — M65342 Trigger finger, left ring finger: Secondary | ICD-10-CM | POA: Diagnosis not present

## 2021-09-30 DIAGNOSIS — E785 Hyperlipidemia, unspecified: Secondary | ICD-10-CM | POA: Diagnosis not present

## 2021-09-30 DIAGNOSIS — M65341 Trigger finger, right ring finger: Secondary | ICD-10-CM | POA: Diagnosis not present

## 2021-09-30 DIAGNOSIS — Z8619 Personal history of other infectious and parasitic diseases: Secondary | ICD-10-CM | POA: Diagnosis not present

## 2021-10-21 DIAGNOSIS — M65332 Trigger finger, left middle finger: Secondary | ICD-10-CM | POA: Diagnosis not present

## 2021-10-21 DIAGNOSIS — M65341 Trigger finger, right ring finger: Secondary | ICD-10-CM | POA: Diagnosis not present

## 2021-10-21 DIAGNOSIS — M65352 Trigger finger, left little finger: Secondary | ICD-10-CM | POA: Diagnosis not present

## 2021-10-21 DIAGNOSIS — M65331 Trigger finger, right middle finger: Secondary | ICD-10-CM | POA: Diagnosis not present

## 2021-10-30 DIAGNOSIS — M109 Gout, unspecified: Secondary | ICD-10-CM | POA: Diagnosis not present

## 2021-10-30 DIAGNOSIS — J101 Influenza due to other identified influenza virus with other respiratory manifestations: Secondary | ICD-10-CM | POA: Diagnosis not present

## 2021-10-30 DIAGNOSIS — Z743 Need for continuous supervision: Secondary | ICD-10-CM | POA: Diagnosis not present

## 2021-10-30 DIAGNOSIS — E872 Acidosis, unspecified: Secondary | ICD-10-CM | POA: Diagnosis not present

## 2021-10-30 DIAGNOSIS — I110 Hypertensive heart disease with heart failure: Secondary | ICD-10-CM | POA: Diagnosis not present

## 2021-10-30 DIAGNOSIS — R9439 Abnormal result of other cardiovascular function study: Secondary | ICD-10-CM | POA: Diagnosis not present

## 2021-10-30 DIAGNOSIS — Z79899 Other long term (current) drug therapy: Secondary | ICD-10-CM | POA: Diagnosis not present

## 2021-10-30 DIAGNOSIS — R079 Chest pain, unspecified: Secondary | ICD-10-CM | POA: Diagnosis not present

## 2021-10-30 DIAGNOSIS — F1010 Alcohol abuse, uncomplicated: Secondary | ICD-10-CM | POA: Diagnosis not present

## 2021-10-30 DIAGNOSIS — Z7982 Long term (current) use of aspirin: Secondary | ICD-10-CM | POA: Diagnosis not present

## 2021-10-30 DIAGNOSIS — F17210 Nicotine dependence, cigarettes, uncomplicated: Secondary | ICD-10-CM | POA: Diagnosis not present

## 2021-10-30 DIAGNOSIS — R0789 Other chest pain: Secondary | ICD-10-CM | POA: Diagnosis not present

## 2021-10-30 DIAGNOSIS — I5032 Chronic diastolic (congestive) heart failure: Secondary | ICD-10-CM | POA: Diagnosis not present

## 2021-10-30 DIAGNOSIS — F419 Anxiety disorder, unspecified: Secondary | ICD-10-CM | POA: Diagnosis not present

## 2021-10-30 DIAGNOSIS — R072 Precordial pain: Secondary | ICD-10-CM | POA: Diagnosis not present

## 2021-10-31 DIAGNOSIS — R0789 Other chest pain: Secondary | ICD-10-CM | POA: Diagnosis not present

## 2021-10-31 DIAGNOSIS — I119 Hypertensive heart disease without heart failure: Secondary | ICD-10-CM | POA: Diagnosis not present

## 2021-10-31 DIAGNOSIS — R072 Precordial pain: Secondary | ICD-10-CM | POA: Diagnosis not present

## 2021-10-31 DIAGNOSIS — R9439 Abnormal result of other cardiovascular function study: Secondary | ICD-10-CM | POA: Diagnosis not present

## 2021-11-01 DIAGNOSIS — R072 Precordial pain: Secondary | ICD-10-CM | POA: Diagnosis not present

## 2021-11-01 DIAGNOSIS — R0789 Other chest pain: Secondary | ICD-10-CM | POA: Diagnosis not present

## 2021-11-03 DIAGNOSIS — R0789 Other chest pain: Secondary | ICD-10-CM | POA: Diagnosis not present

## 2021-11-05 DIAGNOSIS — M65332 Trigger finger, left middle finger: Secondary | ICD-10-CM | POA: Diagnosis not present

## 2021-11-05 DIAGNOSIS — M65352 Trigger finger, left little finger: Secondary | ICD-10-CM | POA: Diagnosis not present

## 2021-11-23 ENCOUNTER — Encounter: Payer: Self-pay | Admitting: Family Medicine

## 2022-01-28 DIAGNOSIS — M65331 Trigger finger, right middle finger: Secondary | ICD-10-CM | POA: Diagnosis not present

## 2022-01-28 DIAGNOSIS — M65341 Trigger finger, right ring finger: Secondary | ICD-10-CM | POA: Diagnosis not present

## 2022-01-28 DIAGNOSIS — M65351 Trigger finger, right little finger: Secondary | ICD-10-CM | POA: Diagnosis not present

## 2022-01-28 DIAGNOSIS — Z9889 Other specified postprocedural states: Secondary | ICD-10-CM | POA: Diagnosis not present

## 2022-02-02 DIAGNOSIS — M65331 Trigger finger, right middle finger: Secondary | ICD-10-CM | POA: Diagnosis not present

## 2022-02-02 DIAGNOSIS — U071 COVID-19: Secondary | ICD-10-CM | POA: Diagnosis not present

## 2022-02-21 DIAGNOSIS — M65331 Trigger finger, right middle finger: Secondary | ICD-10-CM | POA: Diagnosis not present

## 2022-03-15 ENCOUNTER — Encounter: Payer: Self-pay | Admitting: Family Medicine

## 2022-03-15 DIAGNOSIS — D2361 Other benign neoplasm of skin of right upper limb, including shoulder: Secondary | ICD-10-CM | POA: Diagnosis not present

## 2022-03-15 DIAGNOSIS — D225 Melanocytic nevi of trunk: Secondary | ICD-10-CM | POA: Diagnosis not present

## 2022-03-15 DIAGNOSIS — L905 Scar conditions and fibrosis of skin: Secondary | ICD-10-CM | POA: Diagnosis not present

## 2022-03-15 DIAGNOSIS — L821 Other seborrheic keratosis: Secondary | ICD-10-CM | POA: Diagnosis not present

## 2022-03-15 DIAGNOSIS — L811 Chloasma: Secondary | ICD-10-CM | POA: Diagnosis not present

## 2022-03-15 DIAGNOSIS — D485 Neoplasm of uncertain behavior of skin: Secondary | ICD-10-CM | POA: Diagnosis not present

## 2022-03-16 DIAGNOSIS — M65351 Trigger finger, right little finger: Secondary | ICD-10-CM | POA: Diagnosis not present

## 2022-03-16 DIAGNOSIS — M65341 Trigger finger, right ring finger: Secondary | ICD-10-CM | POA: Diagnosis not present

## 2022-03-18 DIAGNOSIS — M65331 Trigger finger, right middle finger: Secondary | ICD-10-CM | POA: Diagnosis not present

## 2022-03-31 DIAGNOSIS — M65331 Trigger finger, right middle finger: Secondary | ICD-10-CM | POA: Diagnosis not present

## 2022-04-04 DIAGNOSIS — M65331 Trigger finger, right middle finger: Secondary | ICD-10-CM | POA: Diagnosis not present

## 2022-04-19 DIAGNOSIS — D485 Neoplasm of uncertain behavior of skin: Secondary | ICD-10-CM | POA: Diagnosis not present

## 2022-04-19 DIAGNOSIS — L819 Disorder of pigmentation, unspecified: Secondary | ICD-10-CM | POA: Diagnosis not present

## 2022-06-20 ENCOUNTER — Encounter: Payer: Federal, State, Local not specified - PPO | Admitting: Family Medicine

## 2022-08-11 ENCOUNTER — Other Ambulatory Visit: Payer: Self-pay | Admitting: Family Medicine

## 2022-08-24 ENCOUNTER — Encounter: Payer: Self-pay | Admitting: Family Medicine

## 2022-08-24 ENCOUNTER — Ambulatory Visit (INDEPENDENT_AMBULATORY_CARE_PROVIDER_SITE_OTHER): Payer: Federal, State, Local not specified - PPO | Admitting: Family Medicine

## 2022-08-24 ENCOUNTER — Other Ambulatory Visit (INDEPENDENT_AMBULATORY_CARE_PROVIDER_SITE_OTHER): Payer: Federal, State, Local not specified - PPO

## 2022-08-24 VITALS — BP 120/70 | HR 62 | Temp 98.3°F | Ht 66.0 in | Wt 178.8 lb

## 2022-08-24 DIAGNOSIS — Z0000 Encounter for general adult medical examination without abnormal findings: Secondary | ICD-10-CM

## 2022-08-24 DIAGNOSIS — Z125 Encounter for screening for malignant neoplasm of prostate: Secondary | ICD-10-CM

## 2022-08-24 DIAGNOSIS — Z87891 Personal history of nicotine dependence: Secondary | ICD-10-CM | POA: Diagnosis not present

## 2022-08-24 DIAGNOSIS — Z79899 Other long term (current) drug therapy: Secondary | ICD-10-CM | POA: Diagnosis not present

## 2022-08-24 DIAGNOSIS — E785 Hyperlipidemia, unspecified: Secondary | ICD-10-CM

## 2022-08-24 DIAGNOSIS — K148 Other diseases of tongue: Secondary | ICD-10-CM

## 2022-08-24 DIAGNOSIS — Z1159 Encounter for screening for other viral diseases: Secondary | ICD-10-CM

## 2022-08-24 LAB — LIPID PANEL
Cholesterol: 165 mg/dL (ref 0–200)
HDL: 34.4 mg/dL — ABNORMAL LOW (ref >39.00)
NonHDL: 130.12
Total CHOL/HDL Ratio: 5
Triglycerides: 201 mg/dL — ABNORMAL HIGH (ref 0.0–149.0)
VLDL: 40.2 mg/dL — ABNORMAL HIGH (ref 0.0–40.0)

## 2022-08-24 LAB — CBC WITH DIFFERENTIAL/PLATELET
Basophils Absolute: 0.1 10*3/uL (ref 0.0–0.1)
Basophils Relative: 1.3 % (ref 0.0–3.0)
Eosinophils Absolute: 0.8 10*3/uL — ABNORMAL HIGH (ref 0.0–0.7)
Eosinophils Relative: 15.4 % — ABNORMAL HIGH (ref 0.0–5.0)
HCT: 46.5 % (ref 39.0–52.0)
Hemoglobin: 15.1 g/dL (ref 13.0–17.0)
Lymphocytes Relative: 33.6 % (ref 12.0–46.0)
Lymphs Abs: 1.7 10*3/uL (ref 0.7–4.0)
MCHC: 32.5 g/dL (ref 30.0–36.0)
MCV: 91.1 fl (ref 78.0–100.0)
Monocytes Absolute: 0.5 10*3/uL (ref 0.1–1.0)
Monocytes Relative: 9.5 % (ref 3.0–12.0)
Neutro Abs: 2.1 10*3/uL (ref 1.4–7.7)
Neutrophils Relative %: 40.2 % — ABNORMAL LOW (ref 43.0–77.0)
Platelets: 174 10*3/uL (ref 150.0–400.0)
RBC: 5.1 Mil/uL (ref 4.22–5.81)
RDW: 12.1 % (ref 11.5–15.5)
WBC: 5.1 10*3/uL (ref 4.0–10.5)

## 2022-08-24 LAB — COMPREHENSIVE METABOLIC PANEL
ALT: 25 U/L (ref 0–53)
AST: 24 U/L (ref 0–37)
Albumin: 4.7 g/dL (ref 3.5–5.2)
Alkaline Phosphatase: 85 U/L (ref 39–117)
BUN: 11 mg/dL (ref 6–23)
CO2: 27 mEq/L (ref 19–32)
Calcium: 10 mg/dL (ref 8.4–10.5)
Chloride: 103 mEq/L (ref 96–112)
Creatinine, Ser: 1.08 mg/dL (ref 0.40–1.50)
GFR: 74.29 mL/min (ref >60.00)
Glucose, Bld: 82 mg/dL (ref 70–99)
Potassium: 4 mEq/L (ref 3.5–5.1)
Sodium: 139 mEq/L (ref 135–145)
Total Bilirubin: 1.8 mg/dL — ABNORMAL HIGH (ref 0.2–1.2)
Total Protein: 7.5 g/dL (ref 6.0–8.3)

## 2022-08-24 LAB — VITAMIN D 25 HYDROXY (VIT D DEFICIENCY, FRACTURES): VITD: 17.21 ng/mL — ABNORMAL LOW (ref 30.00–100.00)

## 2022-08-24 LAB — PSA: PSA: 1.54 ng/mL (ref 0.10–4.00)

## 2022-08-24 LAB — LDL CHOLESTEROL, DIRECT: Direct LDL: 103 mg/dL

## 2022-08-24 NOTE — Patient Instructions (Addendum)
[facility] contact- call to schedule December colonoscopy- or should say call to confirm prior discussed plans as I do not see them currently validated in our system Please call to schedule visit and/or procedure Address: [address], [place], [place] [address] Phone: [phone]   Please go to [facility] (updated 02/13/2020) - located [address] [place] across [address] from [facility] - in the basement - Hours: 7:30-5:30 PM M-F. You do NOT need an appointment.   We will call you within two weeks about your referral to oral surgeon. If you do not hear within 2 weeks, give [place] a call- you may end up having to self refer but we will try from our end first  Recommended follow up: Return in about 1 year (around 08/25/2023) for physical or sooner if needed.Schedule b4 you leave.

## 2022-08-24 NOTE — Progress Notes (Signed)
Phone: [phone]   Subjective:  Patient presents today for their annual physical. Chief complaint-noted.   See problem oriented charting- ROS- full  review of systems was completed and negative  except for: recurrent mucocele, tinnitus, constipation and occasional blood with straining- due for colonoscopy later this year  The following were reviewed and entered/updated in epic: Past Medical History:  Diagnosis Date   History of hepatitis B    1983. treated. cleared.    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-normal u/s bladder and kidneys, normal psa   Umbilical hernia    s/p surgery   Wears glasses    Patient Active Problem List   Diagnosis Date Noted   Hyperbilirubinemia 11/03/2009    Priority: Medium    Hyperlipidemia 09/27/2007    Priority: Medium    Prostatitis 07/14/2014    Priority: Low   History of adenomatous polyp of colon 11/03/2009    Priority: Low   Umbilical hernia 69/62/9528    Priority: Low   Tinnitus 09/29/2016   Past Surgical History:  Procedure Laterality Date   COLONOSCOPY  2014   1 TA - [facility]   EYE SURGERY     scar tissue    HYPOSPADIAS CORRECTION     INSERTION OF MESH N/A 03/12/2015   Procedure: INSERTION OF MESH;  Surgeon: Donnie Mesa, MD;  Location: [facility];  Service: General;  Laterality: N/A;   UMBILICAL HERNIA REPAIR N/A 03/12/2015   Procedure: UMBILICAL HERNIA REPAIR WITH MESH;  Surgeon: Donnie Mesa, MD;  Location: [facility];  Service: General;  Laterality: N/A;    Family History  Problem Relation Age of Onset   Diabetes Other    Hypertension Other    Prostate cancer Other    Stroke Brother 51       nonsmoker. overweight   Hypertension Mother    Diabetes Mother    CAD Father        nonsmoker. 68 onset died 28   Hypertension Father    Diabetes Father    CAD Brother         half brother   Esophageal cancer Neg Hx    Stomach cancer Neg Hx    Rectal cancer Neg Hx    Colon cancer Neg Hx     Medications- reviewed and updated Current Outpatient Medications  Medication Sig Dispense Refill   simvastatin (ZOCOR) 10 MG tablet TAKE ONE TABLET BY MOUTH ONE TIME DAILY AT BEDTIME 90 tablet 3   No current facility-administered medications for this visit.    Allergies-reviewed and updated Allergies  Allergen Reactions   Prednisone Other (See Comments)    "severe arm pain"    Social History   Social History Narrative   Single lives alone - in relationship. 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 Oropharynx normal other than white plaque on tongue Neck: no thyromegaly CV: RRR no murmurs rubs or gallops Lungs: CTAB no crackles, wheeze, rhonchi Abdomen:  soft/nontender/nondistended/normal bowel sounds. No rebound or guarding.  Ext: no edema Skin: warm, dry Neuro: grossly normal, moves all extremities, PERRLA    Assessment and Plan  61 y.o. male presenting for annual physical.  Health Maintenance counseling: 1. Anticipatory guidance: Patient counseled regarding regular dental exams -q6 months, eye exams -yearly,  avoiding smoking and second hand smoke , limiting alcohol to 2 beverages per day - doesn't drink, no illicit drugs .   2. Risk factor reduction:  Advised patient of need for regular exercise and diet rich and fruits and vegetables to reduce risk of heart attack and stroke.  Exercise- tennis ball machine purchased plus walking 5 miles daily for 2 weeks- has noted a difference.  Diet/weight management-down 4 lbs from last year-  reports actually has lost even more. Has continued efforts for healthier diet  Wt Readings from Last 3 Encounters:  08/24/22 178 lb 12.8 oz (81.1 kg)  06/17/21 182 lb 12.8 oz (82.9 kg)  01/08/21 185 lb (83.9 kg)  3. Immunizations/screenings/ancillary studies- wants to hold off on flu shot. Wants to hold off on covid. Also has had covid twice and tolerated it reasonably well- very mild Immunization History  Administered Date(s) Administered   PFIZER(Purple Top)SARS-COV-2 Vaccination 03/14/2020, 04/04/2020, 11/23/2020   Td 04/28/2009   Tdap 01/09/2011  4. Prostate cancer screening-  low risk prior PSA trend- update with labs  Lab Results  Component Value Date   PSA 1.49 06/17/2021   PSA 1.37 02/09/2018   PSA 1.29 09/01/2016   5. Colon cancer screening - 11/2019 with 3 year repeat due- advised to go ahead and call and schedule- especially with constipation/occasional bleeding 6. Skin cancer screening- saw dermatology last year- did have a spot removed- but benign. advised regular sunscreen use. Denies worrisome, changing, or new skin lesions.  7. Smoking associated screening (lung cancer screening, AAA screen 65-75, UA)- former smoker- basically 1 pack year so very minimal- quit around 89 8. STD screening - not active in 20 years and never had unprotected sex then not tested  Status of chronic or acute concerns   #hyperlipidemia S: Medication: simvastatin '20mg'$  The 10-year ASCVD risk score (Arnett DK, et al., 2019) is: 7.9%  Lab Results  Component Value Date   CHOL 196 06/17/2021   HDL 43.00 06/17/2021   LDLCALC 125 (H) 06/17/2021   LDLDIRECT 141.3 02/08/2013   TRIG 141.0 06/17/2021   CHOLHDL 5 06/17/2021   A/P: his preference is to hold off on ct cardiac scoring. Update lipids- if #s drastically improved hed like to reduce med if possible  #Prior Thrush?- improved last year but apparently had to have nystatin swish and spit at a later date- he has been using this and even with  active use and improvement still has white plaque- recommended seeing oral surgeon and will try to refer but as dental may need to just call directly  -dermatology thought thrush as well he reports  #Vitamin D concern- takes vitamin D in [place] and wants to make sure levels are not getting too high- also had taken 5k units in past  Recommended follow up: Return in about 1 year (around 08/25/2023) for physical or sooner if needed.Schedule b4 you leave.  Lab/Order associations:will come back  fasting   ICD-10-CM   1. Preventative health care  Z00.00     2. Hyperlipidemia, unspecified hyperlipidemia type  E78.5     3. Screening for prostate cancer  Z12.5     4. Former smoker  [phone]  5. Encounter for hepatitis C screening test for low risk patient  Z11.59     6. High risk medication use  Z79.899       No orders of the defined types were placed in this encounter.   Return precautions advised.  [name], MD

## 2022-08-25 ENCOUNTER — Other Ambulatory Visit: Payer: Self-pay | Admitting: Family Medicine

## 2022-08-25 LAB — URINALYSIS, ROUTINE W REFLEX MICROSCOPIC
Bilirubin Urine: NEGATIVE
Hgb urine dipstick: NEGATIVE
Ketones, ur: NEGATIVE
Leukocytes,Ua: NEGATIVE
Nitrite: NEGATIVE
Specific Gravity, Urine: >=1.03 — AB (ref 1.000–1.030)
Total Protein, Urine: 30 — AB
Urine Glucose: NEGATIVE
Urobilinogen, UA: 0.2 (ref 0.0–1.0)
pH: 6 (ref 5.0–8.0)

## 2022-08-25 LAB — HEPATITIS C ANTIBODY: Hepatitis C Ab: NONREACTIVE

## 2022-08-25 MED ORDER — VITAMIN D (ERGOCALCIFEROL) 1.25 MG (50000 UNIT) PO CAPS: 50000.0000 [IU] | ORAL_CAPSULE | ORAL | 1 refills | Status: DC

## 2022-10-18 DIAGNOSIS — E785 Hyperlipidemia, unspecified: Secondary | ICD-10-CM | POA: Diagnosis not present

## 2022-10-18 DIAGNOSIS — H9319 Tinnitus, unspecified ear: Secondary | ICD-10-CM | POA: Diagnosis not present

## 2022-10-24 ENCOUNTER — Encounter: Payer: Self-pay | Admitting: Gastroenterology

## 2022-11-16 ENCOUNTER — Other Ambulatory Visit: Payer: Self-pay | Admitting: Family Medicine

## 2022-11-18 ENCOUNTER — Ambulatory Visit (AMBULATORY_SURGERY_CENTER): Payer: Federal, State, Local not specified - PPO

## 2022-11-18 VITALS — Ht 67.0 in | Wt 184.0 lb

## 2022-11-18 DIAGNOSIS — Z86010 Personal history of colonic polyps: Secondary | ICD-10-CM

## 2022-11-18 MED ORDER — NA SULFATE-K SULFATE-MG SULF 17.5-3.13-1.6 GM/177ML PO SOLN: 1.0000 | Freq: Once | ORAL | 0 refills | Status: AC

## 2022-11-18 NOTE — Progress Notes (Signed)

## 2022-12-06 ENCOUNTER — Encounter: Payer: Self-pay | Admitting: Gastroenterology

## 2022-12-06 ENCOUNTER — Ambulatory Visit (AMBULATORY_SURGERY_CENTER): Payer: Federal, State, Local not specified - PPO | Admitting: Gastroenterology

## 2022-12-06 VITALS — BP 107/71 | HR 79 | Temp 97.7°F | Resp 11 | Ht 66.0 in | Wt 184.0 lb

## 2022-12-06 DIAGNOSIS — Z09 Encounter for follow-up examination after completed treatment for conditions other than malignant neoplasm: Secondary | ICD-10-CM

## 2022-12-06 DIAGNOSIS — Z86010 Personal history of colonic polyps: Secondary | ICD-10-CM | POA: Diagnosis not present

## 2022-12-06 DIAGNOSIS — Z1211 Encounter for screening for malignant neoplasm of colon: Secondary | ICD-10-CM | POA: Diagnosis not present

## 2022-12-06 MED ORDER — SODIUM CHLORIDE 0.9 % IV SOLN: 500.0000 mL | Freq: Once | INTRAVENOUS | Status: DC

## 2022-12-06 NOTE — Op Note (Signed)
[facility] Patient Name: Derek Murray Procedure Date: 12/06/2022 3:36 PM MRN: [id number] Endoscopist: Remo Lipps P. Havery Moros , MD, [id number] Age: 61 Referring MD:  Date of Birth: 09/14/1961 Gender: Male Account #: [id number] Procedure:                Colonoscopy Indications:              High risk colon cancer surveillance: Personal                            history of colonic polyps - 6 polyps removed                            11/2019 - at least 3 adenomas Medicines:                Monitored Anesthesia Care Procedure:                Pre-Anesthesia Assessment:                           - Prior to the procedure, a History and Physical                            was performed, and patient medications and                            allergies were reviewed. The patient's tolerance of                            previous anesthesia was also reviewed. The risks                            and benefits of the procedure and the sedation                            options and risks were discussed with the patient.                            All questions were answered, and informed consent                            was obtained. Prior Anticoagulants: The patient has                            taken no anticoagulant or antiplatelet agents. ASA                            Grade Assessment: II - A patient with mild systemic                            disease. After reviewing the risks and benefits,                            the patient was deemed in satisfactory condition to  undergo the procedure.                           After obtaining informed consent, the colonoscope                            was passed under direct vision. Throughout the                            procedure, the patient's blood pressure, pulse, and                            oxygen saturations were monitored continuously. The                            Olympus CF-HQ190L ([id number])  Colonoscope was                            introduced through the anus and advanced to the the                            cecum, identified by appendiceal orifice and                            ileocecal valve. The colonoscopy was performed                            without difficulty. The patient tolerated the                            procedure well. The quality of the bowel                            preparation was good. The ileocecal valve,                            appendiceal orifice, and rectum were photographed. Scope In: 3:38:05 PM Scope Out: 3:50:42 PM Scope Withdrawal Time: 0 hours 10 minutes 8 seconds  Total Procedure Duration: 0 hours 12 minutes 37 seconds  Findings:                 The perianal and digital rectal examinations were                            normal.                           Internal hemorrhoids were found during                            retroflexion. The hemorrhoids were small.                           The exam was otherwise without abnormality. Complications:            No immediate complications. Estimated blood loss:  None. Estimated Blood Loss:     Estimated blood loss: none. Impression:               - Internal hemorrhoids.                           - The examination was otherwise normal.                           - No polyps Recommendation:           - Patient has a contact number available for                            emergencies. The signs and symptoms of potential                            delayed complications were discussed with the                            patient. Return to normal activities tomorrow.                            Written discharge instructions were provided to the                            patient.                           - Resume previous diet.                           - Continue present medications.                           - Repeat colonoscopy in 5 years for surveillance. Remo Lipps P.  Essica Kiker, MD 12/06/2022 3:55:25 PM This report has been signed electronically.

## 2022-12-06 NOTE — Patient Instructions (Signed)
   Handout on hemorrhoids given to you today   YOU HAD AN ENDOSCOPIC PROCEDURE TODAY AT [facility]:   Refer to the procedure report that was given to you for any specific questions about what was found during the examination.  If the procedure report does not answer your questions, please call your gastroenterologist to clarify.  If you requested that your care partner not be given the details of your procedure findings, then the procedure report has been included in a sealed envelope for you to review at your convenience later.  YOU SHOULD EXPECT: Some feelings of bloating in the abdomen. Passage of more gas than usual.  Walking can help get rid of the air that was put into your GI tract during the procedure and reduce the bloating. If you had a lower endoscopy (such as a colonoscopy or flexible sigmoidoscopy) you may notice spotting of blood in your stool or on the toilet paper. If you underwent a bowel prep for your procedure, you may not have a normal bowel movement for a few days.  Please Note:  You might notice some irritation and congestion in your nose or some drainage.  This is from the oxygen used during your procedure.  There is no need for concern and it should clear up in a day or so.  SYMPTOMS TO REPORT IMMEDIATELY:  Following lower endoscopy (colonoscopy or flexible sigmoidoscopy):  Excessive amounts of blood in the stool  Significant tenderness or worsening of abdominal pains  Swelling of the abdomen that is new, acute  Fever of 100F or higher  For urgent or emergent issues, a gastroenterologist can be reached at any hour by calling [phone]. Do not use MyChart messaging for urgent concerns.    DIET:  We do recommend a small meal at first, but then you may proceed to your regular diet.  Drink plenty of fluids but you should avoid alcoholic beverages for 24 hours.  ACTIVITY:  You should plan to take it easy for the rest of today and you should NOT DRIVE  or use heavy machinery until tomorrow (because of the sedation medicines used during the test).    FOLLOW UP: Our staff will call the number listed on your records the next business day following your procedure.  We will call around 7:15- 8:00 am to check on you and address any questions or concerns that you may have regarding the information given to you following your procedure. If we do not reach you, we will leave a message.     If any biopsies were taken you will be contacted by phone or by letter within the next 1-3 weeks.  Please call us at [phone] if you have not heard about the biopsies in 3 weeks.    SIGNATURES/CONFIDENTIALITY: You and/or your care partner have signed paperwork which will be entered into your electronic medical record.  These signatures attest to the fact that that the information above on your After Visit Summary has been reviewed and is understood.  Full responsibility of the confidentiality of this discharge information lies with you and/or your care-partner.

## 2022-12-06 NOTE — Progress Notes (Signed)
Pt's states no medical or surgical changes since previsit or office visit. 

## 2022-12-06 NOTE — Progress Notes (Signed)
[facility] History and Physical   Primary Care Physician:  Marin Olp, MD   Reason for Procedure:   History of colon polyps  Plan:    colonoscopy     HPI: Derek Murray is a 61 y.o. male  here for colonoscopy surveillance - 6 polyps removed 11/2019, at least 3 adenomas..   Patient denies any bowel symptoms at this time. No family history of colon cancer known. Otherwise feels well without any cardiopulmonary symptoms.   I have discussed risks / benefits of anesthesia and endoscopic procedure with Leta Jungling and they wish to proceed with the exams as outlined today.    Past Medical History:  Diagnosis Date   History of hepatitis B    1983. treated. cleared.    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-normal u/s bladder and kidneys, normal psa   Umbilical hernia    s/p surgery   Wears glasses     Past Surgical History:  Procedure Laterality Date   COLONOSCOPY  2014   1 TA - [place]   EYE SURGERY     scar tissue    HYPOSPADIAS CORRECTION     INSERTION OF MESH N/A 03/12/2015   Procedure: INSERTION OF MESH;  Surgeon: Donnie Mesa, MD;  Location: [facility];  Service: General;  Laterality: N/A;   TRIGGER FINGER RELEASE Left 2022   middle finger & pinky finger   UMBILICAL HERNIA REPAIR N/A 03/12/2015   Procedure: UMBILICAL HERNIA REPAIR WITH MESH;  Surgeon: Donnie Mesa, MD;  Location: [place];  Service: General;  Laterality: N/A;    Prior to Admission medications   Medication Sig Start Date End Date Taking? Authorizing Provider  acidophilus (RISAQUAD) CAPS capsule Take 1 capsule by mouth daily.   Yes [provider]  co-enzyme Q-10 30 MG capsule Take 100 mg by mouth once.   Yes [provider]  Digestive Enzymes TABS Take by mouth.   Yes [provider]   MAGNESIUM PO Take by mouth.   Yes [provider]  Multiple Vitamin (MULTIVITAMIN) capsule Take 2 capsules by mouth daily.   Yes [provider]  simvastatin (ZOCOR) 10 MG tablet TAKE ONE TABLET BY MOUTH ONE TIME DAILY AT BEDTIME 08/11/22  Yes Marin Olp, MD  Turmeric (QC TUMERIC COMPLEX PO) Take by mouth.   Yes [provider]  Vitamin D, Ergocalciferol, (DRISDOL) 1.25 MG (50000 UNIT) CAPS capsule TAKE ONE CAPSULE BY MOUTH EVERY WEEK 11/16/22  Yes Marin Olp, MD    Current Outpatient Medications  Medication Sig Dispense Refill   acidophilus (RISAQUAD) CAPS capsule Take 1 capsule by mouth daily.     co-enzyme Q-10 30 MG capsule Take 100 mg by mouth once.     Digestive Enzymes TABS Take by mouth.     MAGNESIUM PO Take by mouth.     Multiple Vitamin (MULTIVITAMIN) capsule Take 2 capsules by mouth daily.     simvastatin (ZOCOR) 10 MG tablet TAKE ONE TABLET BY MOUTH ONE TIME DAILY AT BEDTIME 90 tablet 3   Turmeric (QC TUMERIC COMPLEX PO) Take by mouth.     Vitamin D, Ergocalciferol, (DRISDOL) 1.25 MG (50000 UNIT) CAPS capsule TAKE ONE CAPSULE BY MOUTH EVERY WEEK 13 capsule 0   Current Facility-Administered Medications  Medication Dose Route Frequency Provider Last Rate Last Admin   0.9 %  sodium chloride  infusion  500 mL Intravenous Once Yetta Flock, MD        Allergies as of 12/06/2022 - Review Complete 12/06/2022  Allergen Reaction Noted   Prednisone Other (See Comments) 11/05/2018    Family History  Problem Relation Age of Onset   Hypertension Mother    Diabetes Mother    CAD Father        nonsmoker. 60 onset died 69   Hypertension Father    Diabetes Father    Stroke Brother 80       nonsmoker. overweight   CAD Brother        half brother   Diabetes Other    Hypertension Other    Prostate cancer Other    Esophageal cancer Neg Hx    Stomach cancer Neg Hx    Rectal cancer Neg Hx    Colon cancer Neg Hx    Colon polyps Neg  Hx     Social History   Socioeconomic History   Marital status: Single    Spouse name: Not on file   Number of children: Not on file   Years of education: Not on file   Highest education level: Not on file  Occupational History   Not on file  Tobacco Use   Smoking status: Former    Packs/day: 0.10    Years: 10.00    Total pack years: 1.00    Types: Cigarettes    Quit date: 12/19/1993    Years since quitting: 28.9   Smokeless tobacco: Never   Tobacco comments:    social smoking  Vaping Use   Vaping Use: Never used  Substance and Sexual Activity   Alcohol use: No   Drug use: No   Sexual activity: Not on file  Other Topics Concern   Not on file  Social History Narrative   Single lives alone - in relationship. Not sexually active       Occupation: Retired- Hydrographic surveyor for [employer]: tennis has faded, walking, tv, music   Social Determinants of Radio broadcast assistant Strain: Not on file  Food Insecurity: Not on file  Transportation Needs: Not on file  Physical Activity: Not on file  Stress: Not on file  Social Connections: Not on file  Intimate Partner Violence: Not on file    Review of Systems: All other review of systems negative except as mentioned in the HPI.  Physical Exam: Vital signs BP 131/88   Pulse 83   Temp 97.7 F (36.5 C)   Ht '5\' 6"'$  (1.676 m)   Wt 184 lb (83.5 kg)   SpO2 98%   BMI 29.70 kg/m   General:   Alert,  Well-developed, pleasant and cooperative in NAD Lungs:  Clear throughout to auscultation.   Heart:  Regular rate and rhythm Abdomen:  Soft, nontender and nondistended.   Neuro/Psych:  Alert and cooperative. Normal mood and affect. A and O x 3  Jolly Mango, MD [facility]

## 2022-12-06 NOTE — Progress Notes (Signed)
Sedate, gd SR, tolerated procedure well, VSS, report to RN 

## 2022-12-07 ENCOUNTER — Telehealth: Payer: Self-pay | Admitting: *Deleted

## 2022-12-07 NOTE — Telephone Encounter (Signed)
  Follow up Call-     12/06/2022    2:44 PM  Call back number  Post procedure Call Back phone  # [phone]  Permission to leave phone message Yes     Patient questions:  Do you have a fever, pain , or abdominal swelling? No. Pain Score  0 *  Have you tolerated food without any problems? Yes.    Have you been able to return to your normal activities? Yes.    Do you have any questions about your discharge instructions: Diet   No. Medications  No. Follow up visit  No.  Do you have questions or concerns about your Care? No.  Actions: * If pain score is 4 or above: No action needed, pain <4.

## 2022-12-16 DIAGNOSIS — M10061 Idiopathic gout, right knee: Secondary | ICD-10-CM | POA: Diagnosis not present

## 2022-12-16 DIAGNOSIS — M109 Gout, unspecified: Secondary | ICD-10-CM | POA: Diagnosis not present

## 2022-12-16 DIAGNOSIS — M25461 Effusion, right knee: Secondary | ICD-10-CM | POA: Diagnosis not present

## 2023-05-04 ENCOUNTER — Other Ambulatory Visit: Payer: Self-pay | Admitting: Family Medicine

## 2023-07-22 ENCOUNTER — Other Ambulatory Visit: Payer: Self-pay | Admitting: Family Medicine

## 2023-08-03 ENCOUNTER — Encounter (INDEPENDENT_AMBULATORY_CARE_PROVIDER_SITE_OTHER): Payer: Self-pay

## 2023-08-24 ENCOUNTER — Other Ambulatory Visit: Payer: Self-pay | Admitting: Family Medicine

## 2023-08-29 ENCOUNTER — Encounter: Payer: Self-pay | Admitting: Family Medicine

## 2023-08-29 ENCOUNTER — Ambulatory Visit (INDEPENDENT_AMBULATORY_CARE_PROVIDER_SITE_OTHER): Payer: Federal, State, Local not specified - PPO | Admitting: Family Medicine

## 2023-08-29 VITALS — BP 114/70 | HR 72 | Temp 98.2°F | Ht 66.0 in | Wt 184.6 lb

## 2023-08-29 DIAGNOSIS — Z131 Encounter for screening for diabetes mellitus: Secondary | ICD-10-CM | POA: Diagnosis not present

## 2023-08-29 DIAGNOSIS — E559 Vitamin D deficiency, unspecified: Secondary | ICD-10-CM

## 2023-08-29 DIAGNOSIS — Z0000 Encounter for general adult medical examination without abnormal findings: Secondary | ICD-10-CM

## 2023-08-29 DIAGNOSIS — R809 Proteinuria, unspecified: Secondary | ICD-10-CM

## 2023-08-29 DIAGNOSIS — E785 Hyperlipidemia, unspecified: Secondary | ICD-10-CM | POA: Diagnosis not present

## 2023-08-29 DIAGNOSIS — E663 Overweight: Secondary | ICD-10-CM

## 2023-08-29 DIAGNOSIS — Z125 Encounter for screening for malignant neoplasm of prostate: Secondary | ICD-10-CM | POA: Diagnosis not present

## 2023-08-29 LAB — COMPREHENSIVE METABOLIC PANEL
ALT: 36 U/L (ref 0–53)
AST: 27 U/L (ref 0–37)
Albumin: 4.7 g/dL (ref 3.5–5.2)
Alkaline Phosphatase: 88 U/L (ref 39–117)
BUN: 16 mg/dL (ref 6–23)
CO2: 28 meq/L (ref 19–32)
Calcium: 10.1 mg/dL (ref 8.4–10.5)
Chloride: 103 meq/L (ref 96–112)
Creatinine, Ser: 1 mg/dL (ref 0.40–1.50)
GFR: 80.9 mL/min (ref >60.00)
Glucose, Bld: 117 mg/dL — ABNORMAL HIGH (ref 70–99)
Potassium: 4.5 meq/L (ref 3.5–5.1)
Sodium: 140 meq/L (ref 135–145)
Total Bilirubin: 1.3 mg/dL — ABNORMAL HIGH (ref 0.2–1.2)
Total Protein: 7.6 g/dL (ref 6.0–8.3)

## 2023-08-29 LAB — URINALYSIS, ROUTINE W REFLEX MICROSCOPIC
Bilirubin Urine: NEGATIVE
Hgb urine dipstick: NEGATIVE
Ketones, ur: NEGATIVE
Leukocytes,Ua: NEGATIVE
Nitrite: NEGATIVE
RBC / HPF: NONE SEEN (ref 0–?)
Specific Gravity, Urine: >=1.03 — AB (ref 1.000–1.030)
Urine Glucose: NEGATIVE
Urobilinogen, UA: 0.2 (ref 0.0–1.0)
pH: 6 (ref 5.0–8.0)

## 2023-08-29 LAB — CBC WITH DIFFERENTIAL/PLATELET
Basophils Absolute: 0.1 10*3/uL (ref 0.0–0.1)
Basophils Relative: 2 % (ref 0.0–3.0)
Eosinophils Absolute: 0.7 10*3/uL (ref 0.0–0.7)
Eosinophils Relative: 14.9 % — ABNORMAL HIGH (ref 0.0–5.0)
HCT: 49.9 % (ref 39.0–52.0)
Hemoglobin: 16.3 g/dL (ref 13.0–17.0)
Lymphocytes Relative: 31.6 % (ref 12.0–46.0)
Lymphs Abs: 1.5 10*3/uL (ref 0.7–4.0)
MCHC: 32.6 g/dL (ref 30.0–36.0)
MCV: 91.7 fl (ref 78.0–100.0)
Monocytes Absolute: 0.4 10*3/uL (ref 0.1–1.0)
Monocytes Relative: 8.6 % (ref 3.0–12.0)
Neutro Abs: 2 10*3/uL (ref 1.4–7.7)
Neutrophils Relative %: 42.9 % — ABNORMAL LOW (ref 43.0–77.0)
Platelets: 169 10*3/uL (ref 150.0–400.0)
RBC: 5.44 Mil/uL (ref 4.22–5.81)
RDW: 12.3 % (ref 11.5–15.5)
WBC: 4.6 10*3/uL (ref 4.0–10.5)

## 2023-08-29 LAB — LIPID PANEL
Cholesterol: 176 mg/dL (ref 0–200)
HDL: 37 mg/dL — ABNORMAL LOW (ref >39.00)
LDL Cholesterol: 108 mg/dL — ABNORMAL HIGH (ref 0–99)
NonHDL: 138.84
Total CHOL/HDL Ratio: 5
Triglycerides: 156 mg/dL — ABNORMAL HIGH (ref 0.0–149.0)
VLDL: 31.2 mg/dL (ref 0.0–40.0)

## 2023-08-29 LAB — MICROALBUMIN / CREATININE URINE RATIO
Creatinine,U: 220.2 mg/dL
Microalb Creat Ratio: 6.1 mg/g (ref 0.0–30.0)
Microalb, Ur: 13.5 mg/dL — ABNORMAL HIGH (ref 0.0–1.9)

## 2023-08-29 LAB — PSA: PSA: 1.71 ng/mL (ref 0.10–4.00)

## 2023-08-29 LAB — VITAMIN D 25 HYDROXY (VIT D DEFICIENCY, FRACTURES): VITD: 37.03 ng/mL (ref 30.00–100.00)

## 2023-08-29 LAB — HEMOGLOBIN A1C: Hgb A1c MFr Bld: 5.8 % (ref 4.6–6.5)

## 2023-08-29 NOTE — Progress Notes (Signed)
Phone: [phone]    Subjective:  Patient presents today for their annual physical. Chief complaint-noted.   See problem oriented charting- ROS- full  review of systems was completed and negative  except for: bump near eyelid, some rash on inner things unchanged for years, occasional popping fingers on right- trigger finger on several- double bandaid splint doesn't help much- comes and goes  The following were reviewed and entered/updated in epic: Past Medical History:  Diagnosis Date   History of hepatitis B    1983. treated. cleared.    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-normal u/s bladder and kidneys, normal psa   Umbilical hernia    s/p surgery   Wears glasses    Patient Active Problem List   Diagnosis Date Noted   Hyperbilirubinemia 11/03/2009    Priority: Medium    Hyperlipidemia 09/27/2007    Priority: Medium    Prostatitis 07/14/2014    Priority: Low   History of adenomatous polyp of colon 11/03/2009    Priority: Low   Umbilical hernia 01/29/2009    Priority: Low   Tinnitus 09/29/2016   Past Surgical History:  Procedure Laterality Date   COLONOSCOPY  2014   1 TA - [place]   EYE SURGERY     scar tissue    HYPOSPADIAS CORRECTION     INSERTION OF MESH N/A 03/12/2015   Procedure: INSERTION OF MESH;  Surgeon: Manus Rudd, MD;  Location: [facility];  Service: General;  Laterality: N/A;   TRIGGER FINGER RELEASE Left 2022   middle finger & pinky finger   UMBILICAL HERNIA REPAIR N/A 03/12/2015   Procedure: UMBILICAL HERNIA REPAIR WITH MESH;  Surgeon: Manus Rudd, MD;  Location: [facility];  Service: General;  Laterality: N/A;    Family History  Problem Relation Age of Onset   Hypertension Mother    Diabetes Mother    Heart disease Mother    CAD Father        nonsmoker. 74 onset died 75    Hypertension Father    Diabetes Father    Heart disease Father    Stroke Brother 79       nonsmoker. overweight   CAD Brother        half brother   Diabetes Other    Hypertension Other    Prostate cancer Other    Esophageal cancer Neg Hx    Stomach cancer Neg Hx    Rectal cancer Neg Hx    Colon cancer Neg Hx    Colon polyps Neg Hx     Medications- reviewed and updated Current Outpatient Medications  Medication Sig Dispense Refill   acidophilus (RISAQUAD) CAPS capsule Take 1 capsule by mouth daily.     co-enzyme Q-10 30 MG capsule Take 100 mg by mouth once.     Digestive Enzymes TABS Take by mouth.     MAGNESIUM PO Take by mouth.     Multiple Vitamin (MULTIVITAMIN) capsule Take 2 capsules by mouth daily.     simvastatin (ZOCOR) 10 MG tablet TAKE ONE TABLET BY MOUTH ONE TIME DAILY AT BEDTIME 90 tablet 3   Vitamin D, Ergocalciferol, (DRISDOL) 1.25 MG (50000 UNIT) CAPS capsule TAKE ONE CAPSULE BY MOUTH EVERY WEEK 13 capsule 0   No current facility-administered medications for this visit.    Allergies-reviewed and updated Allergies  Allergen Reactions   Prednisone Other (See  Comments)    "severe arm pain"    Social History   Social History Narrative   Single lives alone - in relationship. 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 Oropharynx normal other than geographic tongue- plans to see oral surgeon for 2nd opinion- dentist recommended Neck: no thyromegaly CV: RRR no murmurs rubs or gallops Lungs: CTAB no crackles, wheeze, rhonchi Abdomen: soft/nontender/nondistended/normal bowel sounds. No rebound or guarding.  Ext: no edema Skin:  warm, dry Neuro: grossly normal, moves all extremities, PERRLA GU exam other than noted hypospadias     Assessment and Plan:  62 y.o. male presenting for annual physical.  Health Maintenance counseling: 1. Anticipatory guidance: Patient counseled regarding regular dental exams -q6 months, eye exams - yearly- monitoring early cataracts,  avoiding smoking and second hand smoke , limiting alcohol to 2 beverages per day- no alcohol, no illicit drugs.   2. Risk factor reduction:  Advised patient of need for regular exercise and diet rich and fruits and vegetables to reduce risk of heart attack and stroke.  Exercise-5 miles 2 days a week walking last year- down to 3 miles but going 2-3 days a week- tennis infrequent. Some weights and some yardwork.  Diet/weight management-weight up 6 pounds in last year. Has started to make some changes in last few weeks - reducing carbs Wt Readings from Last 3 Encounters:  08/29/23 184 lb 9.6 oz (83.7 kg)  12/06/22 184 lb (83.5 kg)  11/18/22 184 lb (83.5 kg)  3. Immunizations/screenings/ancillary studies-declines COVID vaccination and flu shot, discussed Tetanus, Diphtheria, and Pertussis (Tdap)- declines for now- reminded him if gets cut/scrape needs to have that as soon as possible   Immunization History  Administered Date(s) Administered   PFIZER(Purple Top)SARS-COV-2 Vaccination 03/14/2020, 04/04/2020, 11/23/2020   Td 04/28/2009   Tdap 01/09/2011  4. Prostate cancer screening- low risk prior trend- update with labs today  Lab Results  Component Value Date   PSA 1.54 08/24/2022   PSA 1.49 06/17/2021   PSA 1.37 02/09/2018   5. Colon cancer screening - December 2023 with 5 year repeat planned 6. Skin cancer screening/prevention- lower risk due to melanin content but has seen dermatology in past- benign spot removed- no recent visit. advised regular sunscreen use. Denies worrisome, changing, or new skin lesions.  7. STD screening- patient opts out- never had  unprotected sex then not been tested and not active since around 2005 8. Smoking associated screening- former smoker- 1 pack year and quit around 1995- no regular screening needed  Status of chronic or acute concerns   #hyperlipidemia S: Medication: simvastatin 10 mg- down from 20 mg per his preference- has declined CT calcium scoring as of 2023 -also takes coenzyme q10 Lab Results  Component Value Date   CHOL 165 08/24/2022   HDL 34.40 (L) 08/24/2022   LDLCALC 125 (H) 06/17/2021   LDLDIRECT 103.0 08/24/2022   TRIG 201.0 (H) 08/24/2022   CHOLHDL 5 08/24/2022  A/P: cholesterol was improved last year and he wanted to reduce dose- ideal  LDL at 70 or ess and he is at 103 so would like to see this come down some. He still prefers to reduce medication(s) -declines CT calcium scoring for now   #Vitamin D deficiency- as low as 17 S: Medication: vitamin D 09811 units weekly A/P: has been on this since last year- update D but may need to reduce to 1000 units a day depending on his levels   #tinnitus- ongoing issue- no recent change. 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  Lab/Order associations: fasting   ICD-10-CM   1. Preventative health care  Z00.00     2. Hyperlipidemia, unspecified hyperlipidemia type  E78.5 Comprehensive metabolic panel    CBC with Differential/Platelet    Lipid panel    3. Screening for prostate cancer  Z12.5 PSA    4. Vitamin D deficiency  E55.9 VITAMIN D 25 Hydroxy (Vit-D Deficiency, Fractures)    5. Screening for diabetes mellitus  Z13.1 Hemoglobin A1c    6. Overweight  E66.3 Hemoglobin A1c    7. Proteinuria, unspecified type  R80.9 Urinalysis, Routine w reflex microscopic    Microalbumin / creatinine urine ratio     No orders of the defined types were placed in this encounter.  Return precautions advised.  Tana Conch, MD

## 2023-08-29 NOTE — Patient Instructions (Addendum)
Health Maintenance Due  Topic Date Due   DTaP/Tdap/Td (3 - Td or Tdap) 01/09/2021  Definitely need this if get cut or scrape- declined today  Please stop by lab before you go If you have mychart- we will send your results within 3 business days of [place] receiving them.  If you do not have mychart- we will call you about results within 5 business days of [place] receiving them.  *please also note that you will see labs on mychart as soon as they post. I will later go in and write notes on them- will say "notes from Dr. Durene Cal"   rash in groin- treated with antifungal by dermatology in past with lack of improvement- hyperpigmented plaques with slightly raised border on exam- offered trial steroid cream but did not tolerate prednisone in past- in that case calling dermatology back for their opinion or biopsy would be reasonable.   Recommended follow up: Return in about 1 year (around 08/28/2024) for physical or sooner if needed.Schedule b4 you leave.

## 2023-08-31 ENCOUNTER — Encounter: Payer: Self-pay | Admitting: Family Medicine

## 2023-09-01 ENCOUNTER — Other Ambulatory Visit: Payer: Self-pay | Admitting: Family Medicine

## 2023-09-01 MED ORDER — TRIAMCINOLONE ACETONIDE 0.1 % EX CREA: 1.0000 | TOPICAL_CREAM | Freq: Two times a day (BID) | CUTANEOUS | 0 refills | Status: DC

## 2023-09-01 MED ORDER — SIMVASTATIN 10 MG PO TABS: ORAL_TABLET | ORAL | 3 refills | Status: DC

## 2023-10-17 ENCOUNTER — Other Ambulatory Visit: Payer: Self-pay | Admitting: Family Medicine

## 2023-10-18 DIAGNOSIS — H0013 Chalazion right eye, unspecified eyelid: Secondary | ICD-10-CM | POA: Diagnosis not present

## 2023-10-18 DIAGNOSIS — R052 Subacute cough: Secondary | ICD-10-CM | POA: Diagnosis not present

## 2023-11-29 DIAGNOSIS — H40013 Open angle with borderline findings, low risk, bilateral: Secondary | ICD-10-CM | POA: Diagnosis not present

## 2023-12-01 DIAGNOSIS — Z23 Encounter for immunization: Secondary | ICD-10-CM | POA: Diagnosis not present

## 2024-01-14 ENCOUNTER — Other Ambulatory Visit: Payer: Self-pay | Admitting: Family Medicine

## 2024-03-22 ENCOUNTER — Other Ambulatory Visit: Payer: Self-pay | Admitting: *Deleted

## 2024-03-22 DIAGNOSIS — E860 Dehydration: Secondary | ICD-10-CM

## 2024-04-02 ENCOUNTER — Other Ambulatory Visit (INDEPENDENT_AMBULATORY_CARE_PROVIDER_SITE_OTHER)

## 2024-04-02 DIAGNOSIS — E860 Dehydration: Secondary | ICD-10-CM | POA: Diagnosis not present

## 2024-04-02 LAB — URINALYSIS, ROUTINE W REFLEX MICROSCOPIC
Bilirubin Urine: NEGATIVE
Hgb urine dipstick: NEGATIVE
Ketones, ur: NEGATIVE
Leukocytes,Ua: NEGATIVE
Nitrite: NEGATIVE
RBC / HPF: NONE SEEN (ref 0–?)
Specific Gravity, Urine: 1.02 (ref 1.000–1.030)
Urine Glucose: NEGATIVE
Urobilinogen, UA: 0.2 (ref 0.0–1.0)
pH: 6 (ref 5.0–8.0)

## 2024-04-02 LAB — MICROALBUMIN / CREATININE URINE RATIO
Creatinine,U: 205.2 mg/dL
Microalb Creat Ratio: 53.8 mg/g — ABNORMAL HIGH (ref 0.0–30.0)
Microalb, Ur: 11 mg/dL — ABNORMAL HIGH (ref 0.0–1.9)

## 2024-04-03 ENCOUNTER — Encounter: Payer: Self-pay | Admitting: Family Medicine

## 2024-04-16 ENCOUNTER — Other Ambulatory Visit: Payer: Self-pay | Admitting: Family Medicine

## 2024-05-28 DIAGNOSIS — H40013 Open angle with borderline findings, low risk, bilateral: Secondary | ICD-10-CM | POA: Diagnosis not present

## 2024-05-30 ENCOUNTER — Ambulatory Visit: Admitting: Physician Assistant

## 2024-05-30 ENCOUNTER — Encounter: Payer: Self-pay | Admitting: Physician Assistant

## 2024-05-30 VITALS — BP 138/88 | HR 76 | Temp 98.0°F | Ht 66.0 in | Wt 186.0 lb

## 2024-05-30 DIAGNOSIS — B351 Tinea unguium: Secondary | ICD-10-CM | POA: Diagnosis not present

## 2024-05-30 DIAGNOSIS — M546 Pain in thoracic spine: Secondary | ICD-10-CM | POA: Diagnosis not present

## 2024-05-30 DIAGNOSIS — H0011 Chalazion right upper eyelid: Secondary | ICD-10-CM | POA: Insufficient documentation

## 2024-05-30 NOTE — Patient Instructions (Signed)
 It was great to see you!  We will be in touch with your results    If any worsening symptom(s), please let us  know so we can advise on next steps  Take care,  Cliffton Spradley PA-C

## 2024-05-30 NOTE — Progress Notes (Signed)
 Derek Murray is a 63 y.o. male here for a new problem.  History of Present Illness:   Chief Complaint  Patient presents with   Spasms    Pt states he has been having some pain and spasms started with lower now upper as well for the past 4 to 5 days now getting worse.    HPI  Back pain Pt complains of right-sided back pain starting 4-5 days ago and expresses concern for his kidneys. Pt reports he initially felt the pain in his right lower back and is now radiating to his right mid back. He describes the pain as spasms and it feels more internal with random onset. Whenever he tries to take a deep breath or coughs, he states he can feel the contractions in his back. Pt reports a hx of UTI and has experienced spasms in his back before. Endorses an improving cough. Denies chest pain, nausea, urinary symptoms, night sweats, constipation, or recent travel. Denies trying any treatment.  Nail fungus Pt complains of fungus at the tip of his left big toe nail.  Endorses trying OTC treatment with no clinical effect.  Past Medical History:  Diagnosis Date   History of hepatitis B    1983. treated. cleared.    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-normal u/s bladder and kidneys, normal psa   Umbilical hernia    s/p surgery   Wears glasses      Social History   Tobacco Use   Smoking status: Former    Current packs/day: 0.00    Average packs/day: 0.1 packs/day for 10.0 years (1.0 ttl pk-yrs)    Types: Cigarettes    Start date: 12/20/1983    Quit date: 12/19/1993    Years since quitting: 30.4   Smokeless tobacco: Never   Tobacco comments:    social smoking  Vaping Use   Vaping status: Never Used  Substance Use Topics   Alcohol use: No   Drug use: No    Past Surgical History:  Procedure Laterality Date   COLONOSCOPY  2014   1 TA - [place]   EYE  SURGERY     scar tissue    HYPOSPADIAS CORRECTION     INSERTION OF MESH N/A 03/12/2015   Procedure: INSERTION OF MESH;  Surgeon: Dareen Ebbing, MD;  Location: [facility];  Service: General;  Laterality: N/A;   TRIGGER FINGER RELEASE Left 2022   middle finger & pinky finger   UMBILICAL HERNIA REPAIR N/A 03/12/2015   Procedure: UMBILICAL HERNIA REPAIR WITH MESH;  Surgeon: Dareen Ebbing, MD;  Location: [facility];  Service: General;  Laterality: N/A;    Family History  Problem Relation Age of Onset   Hypertension Mother    Diabetes Mother    Heart disease Mother    CAD Father        nonsmoker. 74 onset died 51   Hypertension Father    Diabetes Father    Heart disease Father    Stroke Brother 60       nonsmoker. overweight   CAD Brother        half brother   Diabetes Other    Hypertension Other    Prostate cancer Other    Esophageal cancer Neg Hx    Stomach cancer Neg Hx    Rectal cancer Neg Hx    Colon cancer Neg Hx  Colon polyps Neg Hx     Allergies  Allergen Reactions   Prednisone  Other (See Comments)    severe arm pain    Current Medications:   Current Outpatient Medications:    acidophilus (RISAQUAD) CAPS capsule, Take 1 capsule by mouth daily., Disp: , Rfl:    co-enzyme Q-10 30 MG capsule, Take 100 mg by mouth once., Disp: , Rfl:    Digestive Enzymes TABS, Take by mouth., Disp: , Rfl:    MAGNESIUM PO, Take by mouth., Disp: , Rfl:    Multiple Vitamin (MULTIVITAMIN) capsule, Take 2 capsules by mouth daily., Disp: , Rfl:    simvastatin  (ZOCOR ) 10 MG tablet, TAKE ONE TABLET BY MOUTH ONE TIME DAILY AT BEDTIME, Disp: 90 tablet, Rfl: 3   Vitamin D , Ergocalciferol , (DRISDOL ) 1.25 MG (50000 UNIT) CAPS capsule, TAKE ONE CAPSULE BY MOUTH EVERY WEEK, Disp: 13 capsule, Rfl: 0   Review of Systems:   Negative unless otherwise specified per HPI.  Vitals:   Vitals:   05/30/24 1055  BP: 138/88  Pulse: 76  Temp: 98 F (36.7 C)  TempSrc:  Temporal  SpO2: 98%  Weight: 186 lb (84.4 kg)  Height: 5' 6 (1.676 m)     Body mass index is 30.02 kg/m.  Physical Exam:   Physical Exam Vitals and nursing note reviewed.  Constitutional:      General: He is not in acute distress.    Appearance: He is well-developed. He is not ill-appearing or toxic-appearing.  HENT:     Head: Normocephalic.   Eyes:     Conjunctiva/sclera: Conjunctivae normal.     Pupils: Pupils are equal, round, and reactive to light.    Cardiovascular:     Rate and Rhythm: Normal rate and regular rhythm.     Pulses: Normal pulses.     Heart sounds: Normal heart sounds, S1 normal and S2 normal.  Pulmonary:     Effort: Pulmonary effort is normal.     Breath sounds: Normal breath sounds.  Abdominal:     Tenderness: There is no right CVA tenderness or left CVA tenderness.   Musculoskeletal:        General: Normal range of motion.     Cervical back: Normal range of motion.     Comments: No decreased ROM 2/2 pain with flexion/extension, lateral side bends, or rotation. No reproducible tenderness with deep palpation to bilateral paraspinal muscles. No bony tenderness. No evidence of erythema, rash or ecchymosis.     Skin:    General: Skin is warm and dry.   Neurological:     Mental Status: He is alert and oriented to person, place, and time.     GCS: GCS eye subscore is 4. GCS verbal subscore is 5. GCS motor subscore is 6.   Psychiatric:        Speech: Speech normal.        Behavior: Behavior normal. Behavior is cooperative.        Thought Content: Thought content normal.        Judgment: Judgment normal.     Assessment and Plan:   1. Acute right-sided thoracic back pain (Primary) - CBC with Differential/Platelet - Comprehensive metabolic panel with GFR - Urine Culture - Urinalysis, Routine w reflex microscopic  No red flags on my exam Unable to reproduce symptoms Symptoms have improved with time Patient is mostly concerned about his  kidney function --will update blood work and check a urinalysis today If any new or worsening symptoms arise, recommend  that he reach out to us  and we will advise further   2. Onychomycosis  Recommend topical Fungi-Nail Discussed that if this is not sufficient he could discuss with his PCP oral or other options  I, Timoteo Force, acting as a Neurosurgeon for [employer], [place]., have documented all relevant documentation on the behalf of Alexander Iba, [place], as directed by  Alexander Iba, PA while in the presence of Alexander Iba, [place].  I, Alexander Iba, [place], have reviewed all documentation for this visit. The documentation on 05/30/24 for the exam, diagnosis, procedures, and orders are all accurate and complete.  Alexander Iba, PA-C

## 2024-05-31 ENCOUNTER — Ambulatory Visit: Payer: Self-pay | Admitting: Physician Assistant

## 2024-05-31 LAB — URINALYSIS, ROUTINE W REFLEX MICROSCOPIC
Bacteria, UA: NONE SEEN /HPF
Bilirubin Urine: NEGATIVE
Glucose, UA: NEGATIVE
Hgb urine dipstick: NEGATIVE
Hyaline Cast: NONE SEEN /LPF
Ketones, ur: NEGATIVE
Nitrite: NEGATIVE
RBC / HPF: NONE SEEN /HPF (ref 0–2)
Specific Gravity, Urine: 1.018 (ref 1.001–1.035)
Squamous Epithelial / HPF: NONE SEEN /HPF (ref <=5)
WBC, UA: NONE SEEN /HPF (ref 0–5)
pH: 6.5 (ref 5.0–8.0)

## 2024-05-31 LAB — CBC WITH DIFFERENTIAL/PLATELET
Absolute Lymphocytes: 1701 {cells}/uL (ref 850–3900)
Absolute Monocytes: 435 {cells}/uL (ref 200–950)
Basophils Absolute: 90 {cells}/uL (ref 0–200)
Basophils Relative: 1.7 %
Eosinophils Absolute: 917 {cells}/uL — ABNORMAL HIGH (ref 15–500)
Eosinophils Relative: 17.3 %
HCT: 48.4 % (ref 38.5–50.0)
Hemoglobin: 16.1 g/dL (ref 13.2–17.1)
MCH: 30 pg (ref 27.0–33.0)
MCHC: 33.3 g/dL (ref 32.0–36.0)
MCV: 90.3 fL (ref 80.0–100.0)
MPV: 11.9 fL (ref 7.5–12.5)
Monocytes Relative: 8.2 %
Neutro Abs: 2157 {cells}/uL (ref 1500–7800)
Neutrophils Relative %: 40.7 %
Platelets: 181 10*3/uL (ref 140–400)
RBC: 5.36 10*6/uL (ref 4.20–5.80)
RDW: 11.5 % (ref 11.0–15.0)
Total Lymphocyte: 32.1 %
WBC: 5.3 10*3/uL (ref 3.8–10.8)

## 2024-05-31 LAB — URINE CULTURE
MICRO NUMBER:: 16572910
Result:: NO GROWTH
SPECIMEN QUALITY:: ADEQUATE

## 2024-05-31 LAB — COMPREHENSIVE METABOLIC PANEL WITH GFR
AG Ratio: 1.7 (calc) (ref 1.0–2.5)
ALT: 30 U/L (ref 9–46)
AST: 22 U/L (ref 10–35)
Albumin: 4.7 g/dL (ref 3.6–5.1)
Alkaline phosphatase (APISO): 91 U/L (ref 35–144)
BUN: 10 mg/dL (ref 7–25)
CO2: 25 mmol/L (ref 20–32)
Calcium: 10 mg/dL (ref 8.6–10.3)
Chloride: 101 mmol/L (ref 98–110)
Creat: 1.05 mg/dL (ref 0.70–1.35)
Globulin: 2.7 g/dL (ref 1.9–3.7)
Glucose, Bld: 139 mg/dL — ABNORMAL HIGH (ref 65–99)
Potassium: 4.5 mmol/L (ref 3.5–5.3)
Sodium: 137 mmol/L (ref 135–146)
Total Bilirubin: 1.7 mg/dL — ABNORMAL HIGH (ref 0.2–1.2)
Total Protein: 7.4 g/dL (ref 6.1–8.1)
eGFR: 80 mL/min/{1.73_m2} (ref >=60)

## 2024-05-31 LAB — MICROSCOPIC MESSAGE

## 2024-07-06 ENCOUNTER — Other Ambulatory Visit: Payer: Self-pay | Admitting: Family Medicine

## 2024-08-29 ENCOUNTER — Encounter: Payer: Self-pay | Admitting: Family Medicine

## 2024-08-29 ENCOUNTER — Ambulatory Visit (INDEPENDENT_AMBULATORY_CARE_PROVIDER_SITE_OTHER): Payer: Federal, State, Local not specified - PPO | Admitting: Family Medicine

## 2024-08-29 ENCOUNTER — Ambulatory Visit: Payer: Self-pay | Admitting: Family Medicine

## 2024-08-29 VITALS — BP 118/70 | HR 68 | Temp 97.4°F | Ht 66.0 in | Wt 181.6 lb

## 2024-08-29 DIAGNOSIS — Z131 Encounter for screening for diabetes mellitus: Secondary | ICD-10-CM | POA: Diagnosis not present

## 2024-08-29 DIAGNOSIS — R809 Proteinuria, unspecified: Secondary | ICD-10-CM | POA: Diagnosis not present

## 2024-08-29 DIAGNOSIS — Z125 Encounter for screening for malignant neoplasm of prostate: Secondary | ICD-10-CM

## 2024-08-29 DIAGNOSIS — Z0000 Encounter for general adult medical examination without abnormal findings: Secondary | ICD-10-CM | POA: Diagnosis not present

## 2024-08-29 DIAGNOSIS — E663 Overweight: Secondary | ICD-10-CM | POA: Diagnosis not present

## 2024-08-29 DIAGNOSIS — E785 Hyperlipidemia, unspecified: Secondary | ICD-10-CM

## 2024-08-29 DIAGNOSIS — E559 Vitamin D deficiency, unspecified: Secondary | ICD-10-CM | POA: Diagnosis not present

## 2024-08-29 LAB — CBC WITH DIFFERENTIAL/PLATELET
Basophils Absolute: 0.1 K/uL (ref 0.0–0.1)
Basophils Relative: 1.3 % (ref 0.0–3.0)
Eosinophils Absolute: 0.7 K/uL (ref 0.0–0.7)
Eosinophils Relative: 15.1 % — ABNORMAL HIGH (ref 0.0–5.0)
HCT: 49.4 % (ref 39.0–52.0)
Hemoglobin: 16.4 g/dL (ref 13.0–17.0)
Lymphocytes Relative: 30 % (ref 12.0–46.0)
Lymphs Abs: 1.5 K/uL (ref 0.7–4.0)
MCHC: 33.2 g/dL (ref 30.0–36.0)
MCV: 89.8 fl (ref 78.0–100.0)
Monocytes Absolute: 0.4 K/uL (ref 0.1–1.0)
Monocytes Relative: 8.7 % (ref 3.0–12.0)
Neutro Abs: 2.2 K/uL (ref 1.4–7.7)
Neutrophils Relative %: 44.9 % (ref 43.0–77.0)
Platelets: 170 K/uL (ref 150.0–400.0)
RBC: 5.5 Mil/uL (ref 4.22–5.81)
RDW: 12.4 % (ref 11.5–15.5)
WBC: 4.9 K/uL (ref 4.0–10.5)

## 2024-08-29 LAB — URINALYSIS, ROUTINE W REFLEX MICROSCOPIC
Bilirubin Urine: NEGATIVE
Hgb urine dipstick: NEGATIVE
Ketones, ur: 15 — AB
Leukocytes,Ua: NEGATIVE
Nitrite: NEGATIVE
Specific Gravity, Urine: 1.025 (ref 1.000–1.030)
Total Protein, Urine: 30 — AB
Urine Glucose: NEGATIVE
Urobilinogen, UA: 0.2 (ref 0.0–1.0)
pH: 6 (ref 5.0–8.0)

## 2024-08-29 LAB — HEMOGLOBIN A1C: Hgb A1c MFr Bld: 6.5 % (ref 4.6–6.5)

## 2024-08-29 LAB — LIPID PANEL
Cholesterol: 181 mg/dL (ref <200)
HDL: 40 mg/dL (ref >=40)
LDL Cholesterol (Calc): 113 mg/dL — ABNORMAL HIGH
Non-HDL Cholesterol (Calc): 141 mg/dL — ABNORMAL HIGH (ref <130)
Total CHOL/HDL Ratio: 4.5 (calc) (ref <5.0)
Triglycerides: 161 mg/dL — ABNORMAL HIGH (ref <150)

## 2024-08-29 LAB — COMPREHENSIVE METABOLIC PANEL WITH GFR
ALT: 24 U/L (ref 0–53)
AST: 21 U/L (ref 0–37)
Albumin: 5 g/dL (ref 3.5–5.2)
Alkaline Phosphatase: 85 U/L (ref 39–117)
BUN: 16 mg/dL (ref 6–23)
CO2: 27 meq/L (ref 19–32)
Calcium: 10.3 mg/dL (ref 8.4–10.5)
Chloride: 99 meq/L (ref 96–112)
Creatinine, Ser: 1 mg/dL (ref 0.40–1.50)
GFR: 80.33 mL/min (ref >60.00)
Glucose, Bld: 117 mg/dL — ABNORMAL HIGH (ref 70–99)
Potassium: 4.7 meq/L (ref 3.5–5.1)
Sodium: 136 meq/L (ref 135–145)
Total Bilirubin: 1.9 mg/dL — ABNORMAL HIGH (ref 0.2–1.2)
Total Protein: 7.8 g/dL (ref 6.0–8.3)

## 2024-08-29 LAB — PSA: PSA: 2.07 ng/mL (ref 0.10–4.00)

## 2024-08-29 LAB — MICROALBUMIN / CREATININE URINE RATIO
Creatinine,U: 204.9 mg/dL
Microalb Creat Ratio: 50.8 mg/g — ABNORMAL HIGH (ref 0.0–30.0)
Microalb, Ur: 10.4 mg/dL — ABNORMAL HIGH (ref 0.0–1.9)

## 2024-08-29 LAB — VITAMIN D 25 HYDROXY (VIT D DEFICIENCY, FRACTURES): VITD: 33.56 ng/mL (ref 30.00–100.00)

## 2024-08-29 NOTE — Progress Notes (Signed)
 Phone: [phone]   Subjective:  Patient presents today for their annual physical. Chief complaint-noted.   See problem oriented charting- ROS- full  review of systems was completed and negative  except for topics noted under acute/chronic concerns  The following were reviewed and entered/updated in epic: Past Medical History:  Diagnosis Date   History of hepatitis B    1983. treated. cleared.    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-normal u/s bladder and kidneys, normal psa   Umbilical hernia    s/p surgery   Wears glasses    Patient Active Problem List   Diagnosis Date Noted   Hyperbilirubinemia 11/03/2009    Priority: Medium    Hyperlipidemia 09/27/2007    Priority: Medium    Prostatitis 07/14/2014    Priority: Low   History of adenomatous polyp of colon 11/03/2009    Priority: Low   Umbilical hernia 01/29/2009    Priority: Low   Vitamin D  deficiency 08/29/2024   Chalazion right upper eyelid 05/30/2024   Tinnitus 09/29/2016   Past Surgical History:  Procedure Laterality Date   COLONOSCOPY  2014   1 TA - [place]   EYE SURGERY     scar tissue    HERNIA REPAIR     HYPOSPADIAS CORRECTION     INSERTION OF MESH N/A 03/12/2015   Procedure: INSERTION OF MESH;  Surgeon: Donnice Lima, MD;  Location: [facility];  Service: General;  Laterality: N/A;   TRIGGER FINGER RELEASE Left 2022   middle finger & pinky finger   UMBILICAL HERNIA REPAIR N/A 03/12/2015   Procedure: UMBILICAL HERNIA REPAIR WITH MESH;  Surgeon: Donnice Lima, MD;  Location: [facility];  Service: General;  Laterality: N/A;    Family History  Problem Relation Age of Onset   Hypertension Mother    Diabetes Mother    Heart disease Mother    CAD Father        nonsmoker. 74 onset died 78   Hypertension Father    Diabetes Father    Heart disease  Father    Stroke Brother 38       nonsmoker. overweight   CAD Brother        half brother   Diabetes Other    Hypertension Other    Prostate cancer Other    Esophageal cancer Neg Hx    Stomach cancer Neg Hx    Rectal cancer Neg Hx    Colon cancer Neg Hx    Colon polyps Neg Hx     Medications- reviewed and updated Current Outpatient Medications  Medication Sig Dispense Refill   acidophilus (RISAQUAD) CAPS capsule Take 1 capsule by mouth daily.     co-enzyme Q-10 30 MG capsule Take 100 mg by mouth once.     Digestive Enzymes TABS Take by mouth.     MAGNESIUM PO Take by mouth.     Multiple Vitamin (MULTIVITAMIN) capsule Take 2 capsules by mouth daily.     simvastatin  (ZOCOR ) 10 MG tablet TAKE ONE TABLET BY MOUTH ONE TIME DAILY AT BEDTIME 90 tablet 3   Vitamin D , Ergocalciferol , (DRISDOL ) 1.25 MG (50000 UNIT) CAPS capsule TAKE ONE CAPSULE BY MOUTH EVERY WEEK 13 capsule 0   No current facility-administered medications for this visit.    Allergies-reviewed and updated Allergies  Allergen Reactions   Prednisone  Other (See Comments)    severe arm  pain    Social History   Social History Narrative   Single lives alone - in relationship. Not sexually active       Occupation: Retired- Brewing technologist for [employer]   -served in the Electronics engineer until 1990      Hobbies: tennis has faded, walking, tv, music   Objective  Objective:  BP 118/70 (BP Location: Left Arm, Patient Position: Sitting, Cuff Size: Normal)   Pulse 68   Temp (!) 97.4 F (36.3 C) (Temporal)   Ht 5' 6 (1.676 m)   Wt 181 lb 9.6 oz (82.4 kg)   SpO2 98%   BMI 29.31 kg/m  Gen: NAD, resting comfortably HEENT: Mucous membranes are moist. Oropharynx normal Neck: no thyromegaly CV: RRR no murmurs rubs or gallops Lungs: CTAB no crackles, wheeze, rhonchi Abdomen: soft/nontender/nondistended/normal bowel sounds. No rebound or guarding.  Ext: no edema Skin: warm, dry, circular rash bilateral groin hyperpigmented-  no excoriation Neuro: grossly normal, moves all extremities, PERRLA   Assessment and Plan  63 y.o. male presenting for annual physical.  Health Maintenance counseling: 1. Anticipatory guidance: Patient counseled regarding regular dental exams -q6 months, eye exams -yearly,  avoiding smoking and second hand smoke , limiting alcohol to 2 beverages per day - doesn't drink, no illicit drugs .   2. Risk factor reduction:  Advised patient of need for regular exercise and diet rich and fruits and vegetables to reduce risk of heart attack and stroke.  Exercise- started back on treadmill 30 minutes most days- lowest is 3 times a week.  Diet/weight management-down 3 lbs from last yearplus down most of this in last few months as down 5 lbs since June- congratulated efforts- feels could reduce sugars.  Wt Readings from Last 3 Encounters:  08/29/24 181 lb 9.6 oz (82.4 kg)  05/30/24 186 lb (84.4 kg)  08/29/23 184 lb 9.6 oz (83.7 kg)  3. Immunizations/screenings/ancillary studies - opts out flu shot. Opts out COVID.  Shingrix through [facility] planned Immunization History  Administered Date(s) Administered   PFIZER(Purple Top)SARS-COV-2 Vaccination 03/14/2020, 04/04/2020, 11/23/2020   Td 04/28/2009   Td (Adult),5 Lf Tetanus Toxid, Preservative Free 12/01/2023   Tdap 01/09/2011  4. Prostate cancer screening- low risk prior trend- update psa today   Lab Results  Component Value Date   PSA 1.71 08/29/2023   PSA 1.54 08/24/2022   PSA 1.49 06/17/2021   5. Colon cancer screening - December 2023 with Dr. Leigh with plan for 5 year repeat 6. Skin cancer screening- as needed in past-advised regular sunscreen use. Denies worrisome, changing, or new skin lesions.  7. Smoking associated screening (lung cancer screening, AAA screen 65-75, UA)- former smoker- abdominal aortic aneurysm screen at 65-75 consider 8. STD screening - never had unprotected sex and then not been tested. Abstinence since 2005  Status of  chronic or acute concerns   # Trigger finger concern-  left 2nd finger, right 2-4 intermittently. History of prior surgery on left hand. Wants to try double bandaid splint but we can refer to sports medicine or orthopedic or [facility].   #Styes- intermittent issues- one on right lower lid right now- has tried warm compresses and castrol oil with partial relief- he will continue warm compresses - seems to be progressing in right direction.   #groin rash- has seen dermatology in the past but treatments never healed this- appears like tinea cruris but did not respond to topicals or orals in past  #hyperlipidemia S: Medication:  simvastatin  10 mg- down from 20 mg per his preference- has declined CT calcium scoring  -Also takes coenzyme Q 10  Lab Results  Component Value Date   CHOL 176 08/29/2023   HDL 37.00 (L) 08/29/2023   LDLCALC 108 (H) 08/29/2023   LDLDIRECT 103.0 08/24/2022   TRIG 156.0 (H) 08/29/2023   CHOLHDL 5 08/29/2023  A/P: hoping improved this year with weight loss and improved exercise. Declines CT calcium score  #Vitamin D  deficiency- as low as 17 S: Medication: 50000 units high dose right now A/P: update vitamin D  and see if we need to adjust    # Hyperglycemia/insulin resistance/prediabetes-peak A1c 5.8 in 2024 S:  Medication: none Lab Results  Component Value Date   HGBA1C 5.8 08/29/2023   A/P: working on lifestyle to improve this- check a1c today  #ongoing tinnitus- no change and no hearing loss  Recommended follow up: Return in about 1 year (around 08/29/2025) for physical or sooner if needed.Schedule b4 you leave.  Lab/Order associations: fasting   ICD-10-CM   1. Preventative health care  Z00.00     2. Hyperlipidemia, unspecified hyperlipidemia type  E78.5     3. Screening for prostate cancer  Z12.5     4. Screening for diabetes mellitus  Z13.1     5. Overweight  E66.3     6. Vitamin D  deficiency  E55.9       No orders of the  defined types were placed in this encounter.   Return precautions advised.  Garnette Lukes, MD

## 2024-08-29 NOTE — Patient Instructions (Addendum)
 Please stop by lab before you go If you have mychart- we will send your results within 3 business days of us  receiving them.  If you do not have mychart- we will call you about results within 5 business days of us  receiving them.  *please also note that you will see labs on mychart as soon as they post. I will later go in and write notes on them- will say notes from Dr. Katrinka   Recommended follow up: Return in about 1 year (around 08/29/2025) for physical or sooner if needed.Schedule b4 you leave.

## 2024-09-06 ENCOUNTER — Other Ambulatory Visit: Payer: Self-pay | Admitting: Family Medicine

## 2024-09-12 ENCOUNTER — Ambulatory Visit: Payer: Self-pay

## 2024-09-12 NOTE — Telephone Encounter (Signed)
 FYI Only or Action Required?: FYI only for provider.  Patient was last seen in primary care on 08/29/2024 by Katrinka Garnette KIDD, MD.  Called Nurse Triage reporting Insect Bite.  Symptoms began several days ago.  Interventions attempted: OTC medications: campho phenique.  Symptoms are: gradually improving.  Triage Disposition: See Physician Within 24 Hours  Patient/caregiver understands and will follow disposition?: Yes   Copied from CRM [id number]. Topic: Clinical - Red Word Triage >> Sep 12, 2024  9:55 AM Chiquita SQUIBB wrote: Red Word that prompted transfer to Nurse Triage: Patient is calling in stating he believes he may have a bug bite that is swollen, red, and painful Reason for Disposition  [1] Red or very tender (to touch) area AND [2] started over 24 hours after the bite  Answer Assessment - Initial Assessment Questions 1. TYPE of INSECT: What type of insect was it?      unsure 2. ONSET: When did you get bitten?      4 days  3. LOCATION: Where is the insect bite located?      Left shoulder  4. REDNESS: Is the area red or pink? If Yes, ask: What size is the area of redness? (inches or cm). When did the redness start?     Yes spreading  5. PAIN: Is there any pain? If Yes, ask: How bad is the pain? (Scale 0-10; or none, mild, moderate, severe)     1/10 was a 5/10 6. ITCHING: Does it itch? If Yes, ask: How bad is the itch?       7. SWELLING: How big is the swelling? (e.g., inches, cm, or compare to coins)     yes 8. OTHER SYMPTOMS: Do you have any other symptoms?  (e.g., difficulty breathing, fever, hives)      denies  Protocols used: Insect Bite-A-AH

## 2024-09-12 NOTE — Telephone Encounter (Signed)
 Patient scheduled to see Dr. Kennyth 09/13/2024. Dr. Katrinka will review triage note when back in office.

## 2024-09-13 ENCOUNTER — Ambulatory Visit: Admitting: Family Medicine

## 2024-09-13 ENCOUNTER — Encounter: Payer: Self-pay | Admitting: Family Medicine

## 2024-09-13 VITALS — BP 120/72 | HR 65 | Temp 98.9°F | Ht 66.0 in | Wt 178.4 lb

## 2024-09-13 DIAGNOSIS — L989 Disorder of the skin and subcutaneous tissue, unspecified: Secondary | ICD-10-CM

## 2024-09-13 NOTE — Progress Notes (Signed)
   Derek Murray is a 63 y.o. male who presents today for an office visit.  Assessment/Plan:  Skin Irritation  Patient with 2 erythematous lesions on left neck consistent with insect bite.  Does have a small amount of surrounding erythema concerning for possible cellulitis.  We discussed trial of antibiotic however he declined.  Does not wish for any prescriptions.  He will try over-the-counter antibiotic cream such as knee is warm.  He will let us  know if not improving.  Did discuss that shingles is a possibility however given his 2 dark punctate lesions at the center of each erythematous lesion that is more likely that this represents a insect bite.  He will let us  know if he develops any other signs or symptoms concerning for shingles such as vesicular lesions, pain, numbness, tingling, etc.     Subjective:  HPI:  See assessment / plan for status of chronic conditions.   Discussed the use of AI scribe software for clinical note transcription with the patient, who gave verbal consent to proceed.  History of Present Illness Derek Murray is a 63 year old male who presents with a suspected infected bug bite.  He noticed a bug bite the day after a bike ride and initially applied a topical treatment, expecting it to resolve. The area became more red and inflamed, prompting him to seek medical evaluation before traveling out of the country for two to three weeks.  He initially considered the possibility of a snake bite. He did not feel the bite when it occurred and reports no significant pain, although there was a slight burning or stinging sensation when touched early on.  He has been using an over-the-counter product similar to Neosporin, specifically mentioning an 'Equate version'. He prefers to avoid oral antibiotics and is considering using stronger over-the-counter topical treatments if necessary.  No significant pain, numbness, tingling, or blister formation at the site of the  bite. The redness is the primary concern, and he is monitoring for any changes.         Objective:  Physical Exam: BP 120/72   Pulse 65   Temp 98.9 F (37.2 C) (Temporal)   Ht 5' 6 (1.676 m)   Wt 178 lb 6.4 oz (80.9 kg)   SpO2 98%   BMI 28.79 kg/m   Gen: No acute distress, resting comfortably Skin: 2 erythematous lesions on left neck approximately 1 cm in diameter with central small black punctate lesion Neuro: Grossly normal, moves all extremities Psych: Normal affect and thought content      Terita Hejl M. Kennyth, MD 09/13/2024 1:18 PM

## 2024-09-13 NOTE — Patient Instructions (Signed)
 It was very nice to see you today!  VISIT SUMMARY: You came in today because of a red and inflamed bug bite on your leg. You were concerned about the possibility of a snake bite, but it appears to be an insect bite.  YOUR PLAN: LOCALIZED SKIN REACTION DUE TO INSECT BITE: You have a red and inflamed area on your leg, likely from an insect bite. There is no significant pain, numbness, tingling, or blistering, which makes shingles unlikely. -Use over-the-counter Neosporin twice daily on the affected area. -Monitor the area for any signs of shingles, such as pain, numbness, tingling, or blistering. -Contact the clinic if your symptoms do not improve or if they worsen.  Return if symptoms worsen or fail to improve.   Take care, Dr Kennyth  PLEASE NOTE:  If you had any lab tests, please let us  know if you have not heard back within a few days. You may see your results on mychart before we have a chance to review them but we will give you a call once they are reviewed by us .   If we ordered any referrals today, please let us  know if you have not heard from their office within the next week.   If you had any urgent prescriptions sent in today, please check with the pharmacy within an hour of our visit to make sure the prescription was transmitted appropriately.   Please try these tips to maintain a healthy lifestyle:  Eat at least 3 REAL meals and 1-2 snacks per day.  Aim for no more than 5 hours between eating.  If you eat breakfast, please do so within one hour of getting up.   Each meal should contain half fruits/vegetables, one quarter protein, and one quarter carbs (no bigger than a computer mouse)  Cut down on sweet beverages. This includes juice, soda, and sweet tea.   Drink at least 1 glass of water with each meal and aim for at least 8 glasses per day  Exercise at least 150 minutes every week.

## 2024-10-12 ENCOUNTER — Other Ambulatory Visit: Payer: Self-pay | Admitting: Family Medicine

## 2024-11-07 ENCOUNTER — Ambulatory Visit (HOSPITAL_BASED_OUTPATIENT_CLINIC_OR_DEPARTMENT_OTHER): Admitting: Family Medicine

## 2024-11-07 ENCOUNTER — Encounter (HOSPITAL_BASED_OUTPATIENT_CLINIC_OR_DEPARTMENT_OTHER): Payer: Self-pay | Admitting: Family Medicine

## 2024-11-07 VITALS — BP 152/81 | HR 66 | Ht 66.0 in | Wt 186.0 lb

## 2024-11-07 DIAGNOSIS — M79673 Pain in unspecified foot: Secondary | ICD-10-CM | POA: Insufficient documentation

## 2024-11-07 DIAGNOSIS — M79671 Pain in right foot: Secondary | ICD-10-CM | POA: Diagnosis not present

## 2024-11-07 NOTE — Progress Notes (Signed)
    Procedures performed today:    None.  Independent interpretation of notes and tests performed by another provider:   None.  Brief History, Exam, Impression, and Recommendations:    BP (!) 152/81 (BP Location: Right Arm, Patient Position: Sitting, Cuff Size: Normal)   Pulse 66   Ht 5' 6 (1.676 m)   Wt 186 lb (84.4 kg)   SpO2 98%   BMI 30.02 kg/m   Discussed the use of AI scribe software for clinical note transcription with the patient, who gave verbal consent to proceed.  History of Present Illness Derek Murray is a 63 year old male who presents with discomfort at the back of the right foot.  He has been experiencing discomfort at the back of his right foot for about a week and a half, primarily noticed during activities involving bending over or stretching in specific positions. The sensation is described as related to a ligament or tendon but does not interfere with daily activities such as walking.  There is no history of prior issues in this area, and he denies any swelling, bruising, or discoloration. He has not used any topical or oral medications for the discomfort as it has not been severe enough to warrant treatment.  He mentions occasional plantar fascia type issues in the past but nothing currently bothersome. He has no history of ankle injuries.  He is concerned about the potential for the discomfort to develop into a more significant issue.  On exam, patient is in no acute distress.  Over posterior heel, no obvious skin changes or swelling noted.  No bruising or erythema.  He does not have any significant tenderness to palpation through Achilles tendon, both at insertion or proximally through tendon.  Negative calcaneal squeeze.  Normal strength with heel lift.  Normal gait.  Pain of right heel Assessment & Plan: Intermittent discomfort at the Achilles tendon insertion on the right heel for 1.5 weeks, mild, without swelling or significant pain. - Provided  handout with stretching and strengthening exercises. - Advised use of heel wedges in shoes if symptoms worsen. - Monitor symptoms and consider physical therapy if condition progresses. - Schedule follow-up as needed if symptoms worsen.   Return if symptoms worsen or fail to improve.   ___________________________________________ Tammera Engert de [place], MD, ABFM, [facility]

## 2024-11-08 NOTE — Assessment & Plan Note (Signed)
 Intermittent discomfort at the Achilles tendon insertion on the right heel for 1.5 weeks, mild, without swelling or significant pain. - Provided handout with stretching and strengthening exercises. - Advised use of heel wedges in shoes if symptoms worsen. - Monitor symptoms and consider physical therapy if condition progresses. - Schedule follow-up as needed if symptoms worsen.

## 2024-12-16 ENCOUNTER — Ambulatory Visit: Admitting: Family Medicine

## 2024-12-16 ENCOUNTER — Ambulatory Visit: Payer: Self-pay

## 2024-12-16 ENCOUNTER — Encounter: Payer: Self-pay | Admitting: Family Medicine

## 2024-12-16 VITALS — BP 144/80 | HR 69 | Temp 98.3°F | Wt 190.8 lb

## 2024-12-16 DIAGNOSIS — L03211 Cellulitis of face: Secondary | ICD-10-CM | POA: Diagnosis not present

## 2024-12-16 MED ORDER — DOXYCYCLINE HYCLATE 100 MG PO CAPS: 100.0000 mg | ORAL_CAPSULE | Freq: Two times a day (BID) | ORAL | 0 refills | Status: AC

## 2024-12-16 NOTE — Progress Notes (Signed)
 "  Established Patient Office Visit  Subjective   Patient ID: Derek Murray, male    DOB: 01/19/1961  Age: 63 y.o. MRN: [id number]  Chief Complaint  Patient presents with   Eye Problem   Rash    HPI   Derek Murray is seen today as a work in with some redness and swelling left face just below the eye.  Derek Murray had another pair of glasses in the nose rest broke off and Derek Murray initially wondered if that may have irritated his face.  Derek Murray also recalls about the time Derek Murray noticed some redness couple weeks ago Derek Murray had used a turmeric soap bar that had some grains within the bar and Derek Murray was scrubbing his face and Derek Murray thinks Derek Murray may have scratched the face with that.  In any event, has had some redness and swelling since then.  Derek Murray has been using some topical Neosporin.  No drainage.  No fevers or chills.  No actual eye pain.  No visual changes.  No drainage from the eye itself.  No forehead rash.  Past Medical History:  Diagnosis Date   History of hepatitis B    1983. treated. cleared.    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-normal u/s bladder and kidneys, normal psa   Umbilical hernia    s/p surgery   Wears glasses    Past Surgical History:  Procedure Laterality Date   COLONOSCOPY  2014   1 TA - [facility]   EYE SURGERY     scar tissue    HERNIA REPAIR     HYPOSPADIAS CORRECTION     INSERTION OF MESH N/A 03/12/2015   Procedure: INSERTION OF MESH;  Surgeon: Donnice Lima, MD;  Location: [facility];  Service: General;  Laterality: N/A;   TRIGGER FINGER RELEASE Left 2022   middle finger & pinky finger   UMBILICAL HERNIA REPAIR N/A 03/12/2015   Procedure: UMBILICAL HERNIA REPAIR WITH MESH;  Surgeon: Donnice Lima, MD;  Location: [facility];  Service: General;  Laterality: N/A;    reports that Derek Murray quit smoking about 31 years ago. His smoking use  included cigarettes. Derek Murray started smoking about 41 years ago. Derek Murray has a 1 pack-year smoking history. Derek Murray has never used smokeless tobacco. Derek Murray reports that Derek Murray does not drink alcohol and does not use drugs. family history includes CAD in his brother and father; Diabetes in his father, mother, and another family member; Heart disease in his father and mother; Hypertension in his father, mother, and another family member; Prostate cancer in an other family member; Stroke (age of onset: 64) in his brother. Allergies[1]  Review of Systems  Constitutional:  Negative for chills and fever.  Eyes:  Negative for blurred vision, pain and discharge.  Skin:  Positive for rash.  Neurological:  Negative for headaches.      Objective:     BP (!) 144/80   Pulse 69   Temp 98.3 F (36.8 C) (Oral)   Wt 190 lb 12.8 oz (86.5 kg)   SpO2 97%   BMI 30.80 kg/m  BP Readings from Last 3 Encounters:  12/16/24 (!) 144/80  11/07/24 (!) 152/81  09/13/24 120/72   Wt Readings from Last 3 Encounters:  12/16/24 190 lb 12.8 oz (86.5 kg)  11/07/24 186 lb (84.4 kg)  09/13/24 178 lb 6.4 oz (80.9 kg)  Physical Exam Vitals reviewed.  Constitutional:      General: Derek Murray is not in acute distress.    Appearance: Normal appearance. Derek Murray is not ill-appearing.  Eyes:     Conjunctiva/sclera: Conjunctivae normal.     Pupils: Pupils are equal, round, and reactive to light.  Cardiovascular:     Rate and Rhythm: Normal rate and regular rhythm.  Skin:    Comments: Face exam reveals area of erythema extending from the left base of the nose down about 1 cm below the left lower eyelid.  Near the top of area of erythema Derek Murray has small eschar.  Mild swelling.  No fluctuance.  Neurological:     Mental Status: Derek Murray is alert.      No results found for any visits on 12/16/24.    The 10-year ASCVD risk score (Arnett DK, et al., 2019) is: 11.7%    Assessment & Plan:    Left facial erythema.  Patient queries whether this  originated from using a type of soap bar that had several grains within the bar and Derek Murray thinks Derek Murray may have scratched the skin.  Derek Murray has some mild cellulitis changes.  No periorbital cellulitis.  We did entertain possibility of shingles but no clear vesicles at this time.  -Start doxycycline 100 mg twice daily for 7 days - Follow-up promptly for any progressive redness, swelling, or warmth - Follow-up immediately for any visual changes  Wolm Scarlet, MD     [1]  Allergies Allergen Reactions   Prednisone  Other (See Comments)    severe arm pain   "

## 2024-12-16 NOTE — Telephone Encounter (Signed)
 FYI Only or Action Required?: FYI only for provider: appointment scheduled on 12.29.25.  Patient was last seen in primary care on 11/07/2024 by [place], Quintin PARAS, MD.  Called Nurse Triage reporting Eye Problem and Rash.  Symptoms began several weeks ago.  Interventions attempted: OTC medications: neosporin.  Symptoms are: gradually worsening.  Triage Disposition: See HCP Within 4 Hours (Or PCP Triage)  Patient/caregiver understands and will follow disposition?: Yes   Copied from CRM [id number]. Topic: Clinical - Red Word Triage >> Dec 16, 2024 11:22 AM Amy B wrote: Red Word that prompted transfer to Nurse Triage: Infection below left eye, a little blood and inflammation, pain Reason for Disposition  [1] Looks infected (e.g., spreading redness, pus) AND [2] large red area (> 2 inches or 5 cm)  Answer Assessment - Initial Assessment Questions Rash under his left eye from the inside corner down to his nose. States it looked like a pimple at first  and it did drain come blood and brownish stuff out. Now it states it looks like one big bump. States there is inflammation under eye, tender to touch. He thought it was shingles at first but the pharmacist told him it's not. Denies any drainage from the eye, any eye involvement, or changed in vision. Pt scheduled for today. He states neosporin seems to be helping.     1. APPEARANCE of RASH: What does the rash look like? (e.g., blisters, dry flaky skin, red spots, redness, sores)     Redness, pimple like bumps initially 2. LOCATION: Where is the rash located?      Under left eye 3. NUMBER: How many spots are there?      Now just one 4. SIZE: How big are the spots? (e.g., inches, cm; or compare to size of pinhead, tip of pen, eraser, pea)      From inside of eye lid down to his nose 5. ONSET: When did the rash start?      1.5 weeks ago 6. PAIN: Does the rash hurt? If Yes, ask: How bad is the pain?  (Scale 0-10; or none, mild,  moderate, severe)     Tender to touch, but states that is getting better 8. OTHER SYMPTOMS: Do you have any other symptoms? (e.g., fever)  Denies any other symptoms.  Protocols used: Rash or Redness - Localized-A-AH

## 2024-12-16 NOTE — Telephone Encounter (Signed)
 Appt scheduled for 12/16/2024. Dr. Katrinka will review triage notes.

## 2024-12-16 NOTE — Patient Instructions (Signed)
 Watch for any progressive redness, swelling, or warmth.

## 2025-01-06 ENCOUNTER — Other Ambulatory Visit: Payer: Self-pay | Admitting: Family Medicine

## 2025-01-07 ENCOUNTER — Other Ambulatory Visit: Payer: Self-pay

## 2025-01-07 ENCOUNTER — Telehealth: Payer: Self-pay | Admitting: Family Medicine

## 2025-01-07 MED ORDER — SIMVASTATIN 10 MG PO TABS: ORAL_TABLET | ORAL | 3 refills | Status: AC

## 2025-01-07 NOTE — Telephone Encounter (Signed)
 Refill sent.   Copied from CRM [id number]. Topic: Clinical - Medication Refill >> Jan 07, 2025  1:07 PM Brittany M wrote: Medication: simvastatin  (ZOCOR ) 10 MG tablet  Has the patient contacted their pharmacy? Yes (Agent: If no, request that the patient contact the pharmacy for the refill. If patient does not wish to contact the pharmacy document the reason why and proceed with request.) (Agent: If yes, when and what did the pharmacy advise?)  This is the patient's preferred pharmacy:  [facility] [address] - [place], [place] - [address] [place] [address] Phone: [phone] Fax: [phone]  Is this the correct pharmacy for this prescription? Yes If no, delete pharmacy and type the correct one.   Has the prescription been filled recently? Yes  Is the patient out of the medication? Yes  Has the patient been seen for an appointment in the last year OR does the patient have an upcoming appointment? Yes  Can we respond through MyChart? Yes  Agent: Please be advised that Rx refills may take up to 3 business days. We ask that you follow-up with your pharmacy.

## 2025-09-03 ENCOUNTER — Encounter: Admitting: Family Medicine
